# Patient Record
Sex: Male | Born: 1965 | Race: White | Hispanic: No | Smoking: Current every day smoker
Health system: Southern US, Community
[De-identification: ages and names within clinical notes are randomized; demographics above are authoritative.]

## PROBLEM LIST (undated history)

## (undated) DIAGNOSIS — S129XXA Fracture of neck, unspecified, initial encounter: Secondary | ICD-10-CM

## (undated) DIAGNOSIS — I10 Essential (primary) hypertension: Secondary | ICD-10-CM

---

## 1984-07-13 DIAGNOSIS — S129XXA Fracture of neck, unspecified, initial encounter: Secondary | ICD-10-CM

## 1984-07-13 HISTORY — DX: Fracture of neck, unspecified, initial encounter: S12.9XXA

## 1998-04-19 ENCOUNTER — Inpatient Hospital Stay (HOSPITAL_COMMUNITY): Admission: EM | Admit: 1998-04-19 | Discharge: 1998-04-22 | Payer: Self-pay | Admitting: Emergency Medicine

## 1999-11-14 ENCOUNTER — Emergency Department (HOSPITAL_COMMUNITY): Admission: EM | Admit: 1999-11-14 | Discharge: 1999-11-14 | Payer: Self-pay | Admitting: Emergency Medicine

## 1999-11-14 ENCOUNTER — Encounter: Payer: Self-pay | Admitting: Emergency Medicine

## 2012-09-20 ENCOUNTER — Emergency Department (INDEPENDENT_AMBULATORY_CARE_PROVIDER_SITE_OTHER)
Admission: EM | Admit: 2012-09-20 | Discharge: 2012-09-20 | Disposition: A | Discharge status: Home / Self Care | Payer: Self-pay | Source: Home / Self Care | Attending: Family Medicine | Admitting: Family Medicine

## 2012-09-20 ENCOUNTER — Encounter (HOSPITAL_COMMUNITY): Payer: Self-pay | Admitting: *Deleted

## 2012-09-20 DIAGNOSIS — S0181XA Laceration without foreign body of other part of head, initial encounter: Secondary | ICD-10-CM

## 2012-09-20 DIAGNOSIS — Z23 Encounter for immunization: Secondary | ICD-10-CM

## 2012-09-20 DIAGNOSIS — S0180XA Unspecified open wound of other part of head, initial encounter: Secondary | ICD-10-CM

## 2012-09-20 MED ORDER — TETANUS-DIPHTH-ACELL PERTUSSIS 5-2.5-18.5 LF-MCG/0.5 IM SUSP
0.5000 mL | Freq: Once | INTRAMUSCULAR | Status: AC
  Administered 2012-09-20: 0.5 mL via INTRAMUSCULAR

## 2012-09-20 MED ORDER — TETANUS-DIPHTH-ACELL PERTUSSIS 5-2.5-18.5 LF-MCG/0.5 IM SUSP
INTRAMUSCULAR | Status: AC
  Filled 2012-09-20: qty 0.5

## 2012-09-20 NOTE — ED Notes (Signed)
Pt reports getting out of bed last night and tripping over pants in floor and hitting bridge of nose on doorway - no loc

## 2012-09-20 NOTE — ED Provider Notes (Signed)
History     CSN: [id number]  Arrival date & time 09/20/12  1419   First MD Initiated Contact with Patient 09/20/12 1503      Chief Complaint  Patient presents with  . Facial Laceration    (Consider location/radiation/quality/duration/timing/severity/associated sxs/prior treatment) Patient is a 47 y.o. male presenting with scalp laceration. The history is provided by the patient.  Head Laceration This is a new problem. The current episode started 12 to 24 hours ago (tripped getting out of bed and struck face on wall resulting in lac to bridge of nose from flap of skin loss., no bony deformity, sl soreness, no bleeding.). The problem has not changed since onset.   History reviewed. No pertinent past medical history.  History reviewed. No pertinent past surgical history.  Family History  Problem Relation Age of Onset  . Family history unknown: Yes    History  Substance Use Topics  . Smoking status: Current Every Day Smoker -- 0.50 packs/day    Types: Cigarettes  . Smokeless tobacco: Not on file  . Alcohol Use: Yes     Comment: socially      Review of Systems  Constitutional: Negative.   HENT: Negative.   Skin: Positive for wound.    Allergies  Review of patient's allergies indicates no known allergies.  Home Medications  No current outpatient prescriptions on file.  BP 136/84  Pulse 116  Temp(Src) 98.2 F (36.8 C) (Oral)  Resp 20  SpO2 98%  Physical Exam  Nursing note and vitals reviewed. Constitutional: He is oriented to person, place, and time. He appears well-developed and well-nourished.  HENT:  Head: Normocephalic.    Eyes: Conjunctivae are normal. Pupils are equal, round, and reactive to light.  Neck: Normal range of motion. Neck supple.  Neurological: He is alert and oriented to person, place, and time.  Skin: Skin is warm and dry.    ED Course  Procedures (including critical care time)  Labs Reviewed - No data to display No results  found.   1. Facial laceration, initial encounter       MDM          Linna Hoff, MD 09/20/12 [phone]

## 2015-03-29 ENCOUNTER — Encounter (HOSPITAL_COMMUNITY): Payer: Self-pay

## 2015-03-29 ENCOUNTER — Emergency Department (HOSPITAL_COMMUNITY)
Admission: EM | Admit: 2015-03-29 | Discharge: 2015-03-29 | Disposition: A | Discharge status: Home / Self Care | Payer: Self-pay | Attending: Emergency Medicine | Admitting: Emergency Medicine

## 2015-03-29 DIAGNOSIS — Z8781 Personal history of (healed) traumatic fracture: Secondary | ICD-10-CM | POA: Insufficient documentation

## 2015-03-29 DIAGNOSIS — R030 Elevated blood-pressure reading, without diagnosis of hypertension: Secondary | ICD-10-CM | POA: Insufficient documentation

## 2015-03-29 DIAGNOSIS — IMO0001 Reserved for inherently not codable concepts without codable children: Secondary | ICD-10-CM

## 2015-03-29 DIAGNOSIS — M5416 Radiculopathy, lumbar region: Secondary | ICD-10-CM

## 2015-03-29 DIAGNOSIS — Z720 Tobacco use: Secondary | ICD-10-CM | POA: Insufficient documentation

## 2015-03-29 HISTORY — DX: Fracture of neck, unspecified, initial encounter: S12.9XXA

## 2015-03-29 MED ORDER — METHOCARBAMOL 500 MG PO TABS: 1000.0000 mg | ORAL_TABLET | Freq: Four times a day (QID) | ORAL | Status: DC | PRN

## 2015-03-29 MED ORDER — PREDNISONE 50 MG PO TABS: ORAL_TABLET | ORAL | Status: DC

## 2015-03-29 MED ORDER — PREDNISONE 20 MG PO TABS
60.0000 mg | ORAL_TABLET | Freq: Once | ORAL | Status: AC
  Administered 2015-03-29: 60 mg via ORAL
  Filled 2015-03-29: qty 3

## 2015-03-29 MED ORDER — METHOCARBAMOL 500 MG PO TABS
1000.0000 mg | ORAL_TABLET | Freq: Once | ORAL | Status: AC
Start: 2015-03-29 — End: 2015-03-29
  Administered 2015-03-29: 1000 mg via ORAL
  Filled 2015-03-29: qty 2

## 2015-03-29 MED ORDER — OXYCODONE-ACETAMINOPHEN 5-325 MG PO TABS: ORAL_TABLET | ORAL | Status: DC

## 2015-03-29 MED ORDER — MORPHINE SULFATE (PF) 4 MG/ML IV SOLN
6.0000 mg | Freq: Once | INTRAVENOUS | Status: AC
  Administered 2015-03-29: 6 mg via INTRAMUSCULAR
  Filled 2015-03-29: qty 2

## 2015-03-29 NOTE — ED Provider Notes (Signed)
CSN: [id number]     Arrival date & time 03/29/15  0610 History   First MD Initiated Contact with Patient 03/29/15 [phone]     Chief Complaint  Patient presents with  . Back Pain     (Consider location/radiation/quality/duration/timing/severity/associated sxs/prior Treatment) HPI   Blood pressure 175/113, pulse 97, temperature 97.7 F (36.5 C), temperature source Oral, resp. rate 18, SpO2 99 %.  KMARI HALTER is a 49 y.o. male complaining of severe right-sided low back pain onset 1 week ago after patient had been lifting his father up and down getting out of the wheelchair and digging holes for fence post. Patient's had similar exacerbations in the past. He states the pain radiates to the right gluteus. He's been taking Aleve at home with little relief. States pain is exacerbated by change in position. Denies fever, chills, change in bowel or bladder habits, h/o IDVU or cancer, numbness or weakness.   Past Medical History  Diagnosis Date  . Neck fracture 1986   History reviewed. No pertinent past surgical history. Family History  Problem Relation Age of Onset  . Family history unknown: Yes   Social History  Substance Use Topics  . Smoking status: Current Every Day Smoker -- 0.50 packs/day    Types: Cigarettes  . Smokeless tobacco: None  . Alcohol Use: Yes     Comment: "a 12 pack a week"    Review of Systems  10 systems reviewed and found to be negative, except as noted in the HPI.   Allergies  Review of patient's allergies indicates no known allergies.  Home Medications   Prior to Admission medications   Not on File   BP 175/113 mmHg  Pulse 97  Temp(Src) 97.7 F (36.5 C) (Oral)  Resp 18  SpO2 99% Physical Exam  Constitutional: He appears well-developed and well-nourished.  HENT:  Head: Normocephalic.  Eyes: Conjunctivae are normal.  Neck: Normal range of motion.  Cardiovascular: Normal rate, regular rhythm and intact distal pulses.   Pulmonary/Chest:  Effort normal.  Abdominal: Soft. There is no tenderness.  Neurological: He is alert.  No point tenderness to percussion of lumbar spinal processes.  No TTP or paraspinal muscular spasm. Strength is 5 out of 5 to bilateral lower extremities at hip and knee; extensor hallucis longus 5 out of 5. Ankle strength 5 out of 5, no clonus, neurovascularly intact. No saddle anaesthesia. Patellar reflexes are 2+ bilaterally.    Straight leg raise is positive on the right side at 10, negative on the left.  Patient becomes tearful when he lays down flat secondary to pain   Psychiatric: He has a normal mood and affect.  Nursing note and vitals reviewed.   ED Course  Procedures (including critical care time) Labs Review Labs Reviewed - No data to display  Imaging Review No results found. I have personally reviewed and evaluated these images and lab results as part of my medical decision-making.   EKG Interpretation None      MDM   Final diagnoses:  Elevated blood pressure  Lumbar radiculopathy, acute    Filed Vitals:   03/29/15 0618 03/29/15 0716  BP: 175/113 142/92  Pulse: 97 101  Temp: 97.7 F (36.5 C) 97.9 F (36.6 C)  TempSrc: Oral Oral  Resp: 18 14  Height:  5\' 11"  (1.803 m)  Weight:  240 lb (108.863 kg)  SpO2: 99% 95%    Medications  morphine 4 MG/ML injection 6 mg (6 mg Intramuscular Given 03/29/15 0653)  methocarbamol (  ROBAXIN) tablet 1,000 mg (1,000 mg Oral Given 03/29/15 0653)  predniSONE (DELTASONE) tablet 60 mg (60 mg Oral Given 03/29/15 0653)    Kennieth Francois is a pleasant 49 y.o. male presenting with  back pain.  No neurological deficits and normal neuro exam.  Patient can walk but states is painful.  No loss of bowel or bladder control.  No concern for cauda equina.  No fever, night sweats, weight loss, h/o cancer, IVDU.  RICE protocol and pain medicine indicated and discussed with patient.   Advised patient that his blood pressure was elevated and  counseled him on smoking cessation for 5 minutes. Resource guide given so he can establish primary care.  Evaluation does not show pathology that would require ongoing emergent intervention or inpatient treatment. Pt is hemodynamically stable and mentating appropriately. Discussed findings and plan with patient/guardian, who agrees with care plan. All questions answered. Return precautions discussed and outpatient follow up given.   New Prescriptions   METHOCARBAMOL (ROBAXIN) 500 MG TABLET    Take 2 tablets (1,000 mg total) by mouth 4 (four) times daily as needed (Pain).   OXYCODONE-ACETAMINOPHEN (PERCOCET/ROXICET) 5-325 MG PER TABLET    1 to 2 tabs PO q6hrs  PRN for pain   PREDNISONE (DELTASONE) 50 MG TABLET    Take 1 tablet daily with breakfast         Wynetta Emery, PA-C 03/29/15 0734  Wynetta Emery, PA-C 03/29/15 4098  Derwood Kaplan, MD 03/29/15 [phone]

## 2015-03-29 NOTE — ED Notes (Signed)
Pt states that he has had lower right back pain since Tuesday after picking up his father and putting him in a wheelchair. Pt states that he also dug a hole for a fence post and the back pain has been moderate to severe since then. Pt able to get up out of wheelchair and walk over to the bed grimmacing in pain. Pt denies urinary sx. Has no hx of sciatica.

## 2015-03-29 NOTE — Discharge Instructions (Signed)
Please follow with your primary care doctor in the next 5 days for high blood pressure evaluation. If you do not have a primary care doctor, present to urgent care. Reduce salt intake. Seek emergency medical care for unilateral weakness, slurring, change in vision, or chest pain and shortness of breath.  Please take ibuprofen  (this is normally 2 over the counter pills) every 6 hours (take with food to minimze stomach irritation).   Take robaxin and/or percocet for breakthrough pain, do not drink alcohol, drive, care for children or perfom other critical tasks while taking robaxin and/or percocet.  Do not hesitate to return to the emergency room for any new, worsening or concerning symptoms.  Please obtain primary care using resource guide below. Let them know that you were seen in the emergency room and that they will need to obtain records for further outpatient management.    Emergency Department Resource Guide 1) Find a Doctor and Pay Out of Pocket Although you won't have to find out who is covered by your insurance plan, it is a good idea to ask around and get recommendations. You will then need to call the office and see if the doctor you have chosen will accept you as a new patient and what types of options they offer for patients who are self-pay. Some doctors offer discounts or will set up payment plans for their patients who do not have insurance, but you will need to ask so you aren't surprised when you get to your appointment.  2) Contact Your Local Health Department Not all health departments have doctors that can see patients for sick visits, but many do, so it is worth a call to see if yours does. If you don't know where your local health department is, you can check in your phone book. The CDC also has a tool to help you locate your state's health department, and many state websites also have listings of all of their local health departments.  3) Find a Walk-in Clinic If your  illness is not likely to be very severe or complicated, you may want to try a walk in clinic. These are popping up all over the country in pharmacies, drugstores, and shopping centers. They're usually staffed by nurse practitioners or physician assistants that have been trained to treat common illnesses and complaints. They're usually fairly quick and inexpensive. However, if you have serious medical issues or chronic medical problems, these are probably not your best option.  No Primary Care Doctor: - Call Health Connect at  [phone] - they can help you locate a primary care doctor that  accepts your insurance, provides certain services, etc. - Physician Referral Service- [phone]  Chronic Pain Problems: Organization         Address  Phone   Notes  [facility]  [phone] Patients need to be referred by their primary care doctor.   Medication Assistance: Organization         Address  Phone   Notes  [facility] Medication [facility] [address] [place], [place] [address] [phone] --Must be a resident of [facility] -- Must have NO insurance coverage whatsoever (no Medicaid/ Medicare, etc.) -- The pt. MUST have a primary care doctor that directs their care regularly and follows them in the community   MedAssist  [phone]   [employer]  [phone]    Agencies that provide inexpensive medical care: Organization  Address  Phone   Notes  [place]  [phone]   [facility]    [phone]   [facility] [place], [place] [address] [phone]   [place] [address] [place] [place] [phone]   Planned Parenthood    [phone]   [facility]    [phone]   [place] [address], [place] Phone:  [phone], Fax:  [phone] Hours of Operation:   9 am - 6 pm, M-F.  Also accepts Medicaid/Medicare and self-pay.  [facility] for [place], [address], [place] Phone: [phone], Fax: [phone]. Hours of Operation:  8:30 am - 5:30 pm, M-F.  Also accepts Medicaid and self-pay.  [facility] [address] [place] Phone: [phone]   [place], [place], [place] [phone], Ext. [phone] Mondays & Thursdays: 7-9 AM.  First 15 patients are seen on a first come, first serve basis.    Marty Providers:  Organization         Address  Phone   Notes  [facility] [address], [place] [phone] Also accepts self-pay patients.  [facility] [address], [place]  [phone]   [place], [place] [phone]   [facility] [address] [place] [phone]   Lucianne Lei [address], [place]   [phone] Only accepts [place] Access [place] patients after they have their name applied to their card.   Self-Pay (no insurance) in [facility]:  Organization         Address  Phone   Notes  Sickle Cell Patients, [facility] [place] [phone]   [facility] Urgent Care [place] [phone]   [facility] [place]  [place], [place], [place] [phone]   [facility]/Dr. Osei-Bonsu  [address], [place] or [address], [place] [phone] Phone number for both [place] and [place] locations is the same.  Urgent Medical and [facility] [address] [place] [phone]   [facility] [address] [place] or [address] [phone]   [facility] [address], [place] [phone], phone; [phone], fax Sees patients 1st and 3rd Saturday of every month.  Must not qualify for public or private insurance (i.e. Medicaid, Medicare, [place] Health Choice, Veterans' Benefits)  Household income should be no more than 200% of the poverty level The clinic cannot treat you if you are pregnant or think you are pregnant  Sexually transmitted diseases are not treated at the clinic.    Dental Care: Organization         Address  Phone  Notes  [facility] Department of [facility] [place] [phone] Accepts children up to age 107 who are enrolled in [place] or [place]; pregnant women with a Medicaid card; and children who have applied for Medicaid or [place] Health Choice, but were declined, whose parents can pay a reduced fee at time  of service.  [facility] Department of [facility]  [address], [place] [phone] Accepts children up to age 42 who are enrolled in [place] or [place]; pregnant women with a Medicaid card; and children who have applied for Medicaid or [place] Health Choice, but were declined, whose parents can pay a reduced fee at time of service.  [place] Adult Dental Access PROGRAM  [place] [phone] Patients are seen by appointment only. Walk-ins are not accepted. [place] will see patients 79 years of age and older. Monday - Tuesday (8am-5pm) Most Wednesdays (8:30-5pm) $30 per visit, cash only  [facility] Adult Dental Access PROGRAM  [address], [facility] [phone] Patients are seen by appointment only. Walk-ins are not accepted. [place] will see patients 47 years of age and older. One Wednesday Evening (Monthly: Volunteer Based).  $30 per visit, cash only  [place]  [phone] for adults; Children under age 32, call Graduate Pediatric Dentistry at  [phone]. Children aged 17-14, please call [phone] to request a pediatric application.  Dental services are provided in all areas of dental care including fillings, crowns and bridges, complete and partial dentures, implants, gum treatment, root canals, and extractions. Preventive care is also provided. Treatment is provided to both adults and children. Patients are selected via a lottery and there is often a waiting list.   [facility] [address], [place]  [phone] [email or web address]   [facility] [address] [place], [place] [phone], Ext. [phone] Second and Fourth Thursday of each month, opens at 6:30 AM; Clinic ends at 9 AM.  Patients are seen on a first-come first-served basis, and a limited number are seen during each clinic.   [facility]  [address] Hillard Danker [place], [place] [phone]   Eligibility Requirements You must have lived in [place], [place], or [place] counties for at least the last three months.   You cannot be eligible for state or federal sponsored [employer], including [employer], [place], or [employer].   You generally cannot be eligible for healthcare insurance through your employer.    How to apply: Eligibility screenings are held every Tuesday and Wednesday afternoon from 1:00 pm until 4:00 pm. You do not need an appointment for the interview!  [facility] [address], [place], [place]   [place]  Minor Department  [place]  [phone]    Behavioral Health Resources in the Community: Intensive Outpatient Programs Organization         Address  Phone  Notes  [place]. [address] [place], [place] [phone]   [facility] Outpatient [address], [place], [place]   ADS: Alcohol &  Drug Svcs [address], [place], [place]   [place] [address]  [place], [place] or [phone]   Substance Abuse Resources Organization         Address  Phone  Notes  Alcohol and Drug Services  [phone]   [place]  [phone]   [facility] [place]   Chinita Pester  [phone]   Residential & Outpatient Substance Abuse Program  [phone]   Psychological Services Organization         Address  Phone  Notes  [facility]  [place]  [phone]   [place] [address], [place] or [phone]    Mobile Crisis Teams Organization         Address  Phone  Notes  [facility]rapeutic Alternatives, Mobile Crisis Care Unit  [phone]   Assertive Psychotherapeutic Services  [address] [place], [place]   [place] Levels [address] [place] [phone]    Self-Help/Support Groups Organization         Address  Phone             Notes  [place]. of [place] - variety of support groups  [place] Call for more information  Narcotics Anonymous (NA), [facility] [address], [employer] [place]  2 meetings at this location   Special educational needs teacher         Address  Phone  Notes  ASAP Residential Treatment [place],    [place]  [phone]   [facility]  [address] [place] [phone], [place], [place]   [place], [place] [phone] Admissions: 8am-3pm M-F  Incentives Substance [place] [address]    [place], [place] [phone]   [facility] [address] [place], [place], [place]   [facility] [address]  [place], [place]   [facility]ight Programs - Intensive Outpatient [address], [place], [place]   [facility] [address].,  [place], [place] [phone] or [phone]   [facility]idential Treatment Services (RTS) [address] [place], [place] Accepts Medicaid  [facility] [place] [address]  [place] [phone] Substance Abuse/Addiction Treatment   [facility] Organization         Address  Phone  Notes  [facility]  [phone]   Domenic Schwab, PhD [address] Arlis [place], [place]   [phone] or [phone]   [place], [place] [phone]   [facility] [address], [place], [place] [phone] Insurance/Medicaid/sponsorship through [facility] and Families [address] [place]                                    [place], [place] [phone] [place] [address], [place] [phone]    Dr. Adele Schilder  [phone]   [facility] Dept. 1) [address], [place] 2) [place] 3)  [address], [place] [phone]  [phone]   [place] [phone] or [phone] (After Hours)

## 2015-04-03 ENCOUNTER — Encounter (HOSPITAL_COMMUNITY): Payer: Self-pay | Admitting: *Deleted

## 2015-04-03 ENCOUNTER — Emergency Department (INDEPENDENT_AMBULATORY_CARE_PROVIDER_SITE_OTHER)
Admission: EM | Admit: 2015-04-03 | Discharge: 2015-04-03 | Disposition: A | Discharge status: Home / Self Care | Payer: Self-pay | Source: Home / Self Care | Attending: Family Medicine | Admitting: Family Medicine

## 2015-04-03 DIAGNOSIS — M5441 Lumbago with sciatica, right side: Secondary | ICD-10-CM

## 2015-04-03 MED ORDER — CYCLOBENZAPRINE HCL 5 MG PO TABS: 5.0000 mg | ORAL_TABLET | Freq: Three times a day (TID) | ORAL | Status: DC | PRN

## 2015-04-03 MED ORDER — KETOROLAC TROMETHAMINE 30 MG/ML IJ SOLN
INTRAMUSCULAR | Status: AC
  Filled 2015-04-03: qty 1

## 2015-04-03 MED ORDER — METHYLPREDNISOLONE ACETATE 80 MG/ML IJ SUSP
INTRAMUSCULAR | Status: AC
Start: 2015-04-03 — End: 2015-04-03
  Filled 2015-04-03: qty 1

## 2015-04-03 MED ORDER — METHYLPREDNISOLONE 4 MG PO TBPK: ORAL_TABLET | ORAL | Status: DC

## 2015-04-03 MED ORDER — METHYLPREDNISOLONE ACETATE 80 MG/ML IJ SUSP
80.0000 mg | Freq: Once | INTRAMUSCULAR | Status: AC
  Administered 2015-04-03: 80 mg via INTRAMUSCULAR

## 2015-04-03 MED ORDER — KETOROLAC TROMETHAMINE 30 MG/ML IJ SOLN
30.0000 mg | Freq: Once | INTRAMUSCULAR | Status: AC
  Administered 2015-04-03: 30 mg via INTRAMUSCULAR

## 2015-04-03 NOTE — ED Notes (Signed)
Pt  Reports  He  Injured his  Back   About  1  Week  Ago  After  Lifting  And  Working on a  Fence   He  Reports  Pain in his   Lower back   He  Is  Walking   With a  [employer]

## 2015-04-03 NOTE — Discharge Instructions (Signed)
Use medicine as prescribed, see orthopedist for further back eval and care. Avoid sitting.

## 2015-04-03 NOTE — ED Provider Notes (Signed)
CSN: [id number]     Arrival date & time 04/03/15  1709 History   First MD Initiated Contact with Patient 04/03/15 1807     Chief Complaint  Patient presents with  . Back Pain   (Consider location/radiation/quality/duration/timing/severity/associated sxs/prior Treatment) Patient is a 49 y.o. male presenting with back pain. The history is provided by the patient and the spouse.  Back Pain Location:  Lumbar spine Quality:  Stabbing and shooting Radiates to:  R posterior upper leg and R foot Pain severity:  Moderate Onset quality:  Gradual (seen in er 9/16, meds did not help.) Duration:  2 weeks Progression:  Worsening Chronicity:  New Context: lifting heavy objects   Context comment:  Lifting father from wheelchair, digging fence post holes. Relieved by:  Nothing Associated symptoms: leg pain   Associated symptoms: no abdominal pain, no abdominal swelling, no bladder incontinence, no chest pain, no fever, no paresthesias and no weakness     Past Medical History  Diagnosis Date  . Neck fracture 1986   History reviewed. No pertinent past surgical history. Family History  Problem Relation Age of Onset  . Family history unknown: Yes   Social History  Substance Use Topics  . Smoking status: Current Every Day Smoker -- 0.50 packs/day    Types: Cigarettes  . Smokeless tobacco: None  . Alcohol Use: Yes     Comment: "a 12 pack a week"    Review of Systems  Constitutional: Negative.  Negative for fever.  Cardiovascular: Negative for chest pain.  Gastrointestinal: Negative.  Negative for abdominal pain.  Genitourinary: Negative.  Negative for bladder incontinence.  Musculoskeletal: Positive for back pain and gait problem.  Skin: Negative.   Neurological: Negative for weakness and paresthesias.  All other systems reviewed and are negative.   Allergies  Review of patient's allergies indicates no known allergies.  Home Medications   Prior to Admission medications     Medication Sig Start Date End Date Taking? Authorizing Provider  cyclobenzaprine (FLEXERIL) 5 MG tablet Take 1 tablet (5 mg total) by mouth 3 (three) times daily as needed for muscle spasms. 04/03/15   Linna Hoff, MD  methocarbamol (ROBAXIN) 500 MG tablet Take 2 tablets (1,000 mg total) by mouth 4 (four) times daily as needed (Pain). 03/29/15   Nicole Pisciotta, PA-C  methylPREDNISolone (MEDROL DOSEPAK) 4 MG TBPK tablet follow package directions, start on thurs, take until finished 04/03/15   Linna Hoff, MD  oxyCODONE-acetaminophen (PERCOCET/ROXICET) 5-325 MG per tablet 1 to 2 tabs PO q6hrs  PRN for pain 03/29/15   Joni Reining Pisciotta, PA-C  predniSONE (DELTASONE) 50 MG tablet Take 1 tablet daily with breakfast 03/29/15   Wynetta Emery, PA-C   Meds Ordered and Administered this Visit   Medications  ketorolac (TORADOL) 30 MG/ML injection 30 mg (not administered)  methylPREDNISolone acetate (DEPO-MEDROL) injection 80 mg (not administered)    BP 158/111 mmHg  Pulse 103  Temp(Src) 98.2 F (36.8 C) (Oral)  Resp 20  SpO2 96% No data found.   Physical Exam  Constitutional: He is oriented to person, place, and time. He appears well-developed and well-nourished.  Abdominal: Soft. Bowel sounds are normal.  Musculoskeletal: He exhibits tenderness.       Back:  Neurological: He is alert and oriented to person, place, and time.  Skin: Skin is warm and dry.  Nursing note and vitals reviewed.   ED Course  Procedures (including critical care time)  Labs Review Labs Reviewed - No data to display  Imaging Review No results found.   Visual Acuity Review  Right Eye Distance:   Left Eye Distance:   Bilateral Distance:    Right Eye Near:   Left Eye Near:    Bilateral Near:         MDM   1. Right-sided low back pain with right-sided sciatica        Linna Hoff, MD 04/03/15 [phone]

## 2015-04-17 ENCOUNTER — Ambulatory Visit: Payer: Self-pay | Attending: Internal Medicine

## 2015-04-20 ENCOUNTER — Emergency Department (HOSPITAL_COMMUNITY): Payer: Self-pay

## 2015-04-20 ENCOUNTER — Encounter (HOSPITAL_COMMUNITY): Payer: Self-pay | Admitting: Emergency Medicine

## 2015-04-20 ENCOUNTER — Emergency Department (HOSPITAL_COMMUNITY)
Admission: EM | Admit: 2015-04-20 | Discharge: 2015-04-20 | Disposition: A | Discharge status: Home / Self Care | Payer: Self-pay | Attending: Emergency Medicine | Admitting: Emergency Medicine

## 2015-04-20 DIAGNOSIS — Y998 Other external cause status: Secondary | ICD-10-CM | POA: Insufficient documentation

## 2015-04-20 DIAGNOSIS — Y9389 Activity, other specified: Secondary | ICD-10-CM | POA: Insufficient documentation

## 2015-04-20 DIAGNOSIS — L600 Ingrowing nail: Secondary | ICD-10-CM | POA: Insufficient documentation

## 2015-04-20 DIAGNOSIS — M5441 Lumbago with sciatica, right side: Secondary | ICD-10-CM | POA: Insufficient documentation

## 2015-04-20 DIAGNOSIS — Z720 Tobacco use: Secondary | ICD-10-CM | POA: Insufficient documentation

## 2015-04-20 DIAGNOSIS — W1839XA Other fall on same level, initial encounter: Secondary | ICD-10-CM | POA: Insufficient documentation

## 2015-04-20 DIAGNOSIS — Z7952 Long term (current) use of systemic steroids: Secondary | ICD-10-CM | POA: Insufficient documentation

## 2015-04-20 DIAGNOSIS — Y9289 Other specified places as the place of occurrence of the external cause: Secondary | ICD-10-CM | POA: Insufficient documentation

## 2015-04-20 DIAGNOSIS — S82832A Other fracture of upper and lower end of left fibula, initial encounter for closed fracture: Secondary | ICD-10-CM | POA: Insufficient documentation

## 2015-04-20 MED ORDER — TIZANIDINE HCL 4 MG PO TABS: 4.0000 mg | ORAL_TABLET | Freq: Four times a day (QID) | ORAL | Status: DC | PRN

## 2015-04-20 MED ORDER — METHOCARBAMOL 500 MG PO TABS
500.0000 mg | ORAL_TABLET | Freq: Once | ORAL | Status: AC
  Administered 2015-04-20: 500 mg via ORAL
  Filled 2015-04-20: qty 1

## 2015-04-20 MED ORDER — DIAZEPAM 5 MG PO TABS: 5.0000 mg | ORAL_TABLET | Freq: Every evening | ORAL | Status: DC | PRN

## 2015-04-20 MED ORDER — OXYCODONE-ACETAMINOPHEN 5-325 MG PO TABS
2.0000 | ORAL_TABLET | Freq: Once | ORAL | Status: AC
  Administered 2015-04-20: 2 via ORAL
  Filled 2015-04-20: qty 2

## 2015-04-20 MED ORDER — MELOXICAM 15 MG PO TABS
15.0000 mg | ORAL_TABLET | Freq: Every day | ORAL | Status: DC
Start: 2015-04-20 — End: 2017-03-14

## 2015-04-20 MED ORDER — CEPHALEXIN 500 MG PO CAPS: 500.0000 mg | ORAL_CAPSULE | Freq: Four times a day (QID) | ORAL | Status: DC

## 2015-04-20 NOTE — ED Notes (Signed)
Pt states left ankle twisted on him Monday night. Pedal pulse present. Pt states back pain is ongoing and that he was advised to go see his primary care provider which he does not have. Pt ambulatory to triage. A/O x4 and NAD noted.

## 2015-04-20 NOTE — Discharge Instructions (Signed)
1. Medications:mobic, zanaflex, keflex, usual home medications 2. Treatment: rest, drink plenty of fluids, gentle stretching as discussed, alternate ice and heat; keep splint dry; do not walk on your ankle 3. Follow Up: Please followup with the referral orthopedist in 3 days. Please contact the wellness Center to set up a primary care appointment. Return to the emergency department for possible bowel or bladder control, worsening pain, numbness, tingling or other concerns.    Cast or Splint Care Casts and splints support injured limbs and keep bones from moving while they heal. It is important to care for your cast or splint at home.  HOME CARE INSTRUCTIONS  Keep the cast or splint uncovered during the drying period. It can take 24 to 48 hours to dry if it is made of plaster. A fiberglass cast will dry in less than 1 hour.  Do not rest the cast on anything harder than a pillow for the first 24 hours.  Do not put weight on your injured limb or apply pressure to the cast until your health care provider gives you permission.  Keep the cast or splint dry. Wet casts or splints can lose their shape and may not support the limb as well. A wet cast that has lost its shape can also create harmful pressure on your skin when it dries. Also, wet skin can become infected.  Cover the cast or splint with a plastic bag when bathing or when out in the rain or snow. If the cast is on the trunk of the body, take sponge baths until the cast is removed.  If your cast does become wet, dry it with a towel or a blow dryer on the cool setting only.  Keep your cast or splint clean. Soiled casts may be wiped with a moistened cloth.  Do not place any hard or soft foreign objects under your cast or splint, such as cotton, toilet paper, lotion, or powder.  Do not try to scratch the skin under the cast with any object. The object could get stuck inside the cast. Also, scratching could lead to an infection. If itching is  a problem, use a blow dryer on a cool setting to relieve discomfort.  Do not trim or cut your cast or remove padding from inside of it.  Exercise all joints next to the injury that are not immobilized by the cast or splint. For example, if you have a long leg cast, exercise the hip joint and toes. If you have an arm cast or splint, exercise the shoulder, elbow, thumb, and fingers.  Elevate your injured arm or leg on 1 or 2 pillows for the first 1 to 3 days to decrease swelling and pain.It is best if you can comfortably elevate your cast so it is higher than your heart. SEEK MEDICAL CARE IF:   Your cast or splint cracks.  Your cast or splint is too tight or too loose.  You have unbearable itching inside the cast.  Your cast becomes wet or develops a soft spot or area.  You have a bad smell coming from inside your cast.  You get an object stuck under your cast.  Your skin around the cast becomes red or raw.  You have new pain or worsening pain after the cast has been applied. SEEK IMMEDIATE MEDICAL CARE IF:   You have fluid leaking through the cast.  You are unable to move your fingers or toes.  You have discolored (blue or white), cool, painful, or  very swollen fingers or toes beyond the cast.  You have tingling or numbness around the injured area.  You have severe pain or pressure under the cast.  You have any difficulty with your breathing or have shortness of breath.  You have chest pain.   This information is not intended to replace advice given to you by your health care provider. Make sure you discuss any questions you have with your health care provider.   Document Released: 06/26/2000 Document Revised: 04/19/2013 Document Reviewed: 01/05/2013 Elsevier Interactive Patient Education [employer]

## 2015-04-20 NOTE — Progress Notes (Signed)
Orthopedic Tech Progress Note Patient Details:  Daniel Hanson [facility] [birth year] [id number] Applied fiberglass posterior short leg splint and fiberglass stirrup splint to LLE.  Pulses, sensation, motion intact before and after splinting.  Capillary refill less than 2 seconds before and after splinting.  Fit pt. for crutches and taught use of same.  Upon practice with the crutches, pt. stated "It hurts my right leg too much.  I can't do this."  Assisted pt. back to exam chair and helped him sit.  Reminded pt. he should not put any weight on his injured leg, and reported pt.'s difficulty using crutches to ED MD. Ortho Devices Type of Ortho Device: Crutches, Stirrup splint, Short leg splint Ortho Device/Splint Location: LLE Ortho Device/Splint Interventions: Application   Lesle Chris 04/20/2015, 8:57 PM

## 2015-04-20 NOTE — ED Provider Notes (Signed)
CSN: [id number]     Arrival date & time 04/20/15  1856 History  By signing my name below, I, Daniel Hanson, attest that this documentation has been prepared under the direction and in the presence of non-physician practitioner, Dierdre Forth, PA-C. Electronically Signed: Freida Hanson, Scribe. 04/20/2015. 8:28 PM.    Chief Complaint  Patient presents with  . Back Pain  . Ankle Pain    The history is provided by the patient. No language interpreter was used.   HPI Comments:  Daniel Hanson is a 49 y.o. male who presents to the Emergency Department complaining of persistent lower right sided back pain that started 3 weeks ago. He notes his pain radiates down his right thigh. He was seen in the ED on 04/03/2015 states he was given a shot and that his pain has slightly. He was also evaluated at an urgent care. He was reportedly given Demerol at the urgent care with moderate relief. Between his ED and urgent care visits he has been given prednisone, oxycodone and cyclobenzaprine with little improvement. He notes his overall pain has slightly improved but is still painful. He states that he is walking more upright that previously.  He denies numbness, weakness, gait disturbance, loss of bowel or bladder control.  He reports difficulty sleeping due to pain. He denies h/o CA, IVDA, and DM.  He also complains of moderate left ankle pain after he twisted the ankle ~ 6 days ago. He reports he inverted the ankle causing a fall.  He has been able to ambulate since symptom onset with moderate pain. No alleviating factors noted.  He denies numbness, tingling or discoloration.    No PCP  Past Medical History  Diagnosis Date  . Neck fracture (HCC) 1986   History reviewed. No pertinent past surgical history. Family History  Problem Relation Age of Onset  . Family history unknown: Yes   Social History  Substance Use Topics  . Smoking status: Current Every Day Smoker -- 0.50 packs/day     Types: Cigarettes  . Smokeless tobacco: None  . Alcohol Use: Yes     Comment: "a 12 pack a week"    Review of Systems  Constitutional: Negative for fever, chills and fatigue.  Respiratory: Negative for chest tightness and shortness of breath.   Cardiovascular: Negative for chest pain.  Gastrointestinal: Negative for nausea, vomiting, abdominal pain and diarrhea.  Genitourinary: Negative for dysuria, urgency, frequency and hematuria.  Musculoskeletal: Positive for back pain, arthralgias and gait problem ( 2/2 pain). Negative for joint swelling, neck pain and neck stiffness.       Left ankle  Skin: Negative for rash.  Neurological: Negative for weakness, light-headedness, numbness and headaches.  Hematological: Does not bruise/bleed easily.  Psychiatric/Behavioral: The patient is not nervous/anxious.   All other systems reviewed and are negative.  Allergies  Review of patient's allergies indicates no known allergies.  Home Medications   Prior to Admission medications   Medication Sig Start Date End Date Taking? Authorizing Provider  cyclobenzaprine (FLEXERIL) 5 MG tablet Take 1 tablet (5 mg total) by mouth 3 (three) times daily as needed for muscle spasms. 04/03/15   Linna Hoff, MD  methocarbamol (ROBAXIN) 500 MG tablet Take 2 tablets (1,000 mg total) by mouth 4 (four) times daily as needed (Pain). 03/29/15   Nicole Pisciotta, PA-C  methylPREDNISolone (MEDROL DOSEPAK) 4 MG TBPK tablet follow package directions, start on thurs, take until finished 04/03/15   Linna Hoff, MD  oxyCODONE-acetaminophen (PERCOCET/ROXICET)  5-325 MG per tablet 1 to 2 tabs PO q6hrs  PRN for pain 03/29/15   Joni Reining Pisciotta, PA-C  predniSONE (DELTASONE) 50 MG tablet Take 1 tablet daily with breakfast 03/29/15   Nicole Pisciotta, PA-C   BP 162/84 mmHg  Pulse 98  Temp(Src) 98.2 F (36.8 C) (Oral)  Ht  (1.803 m)  Wt 240 lb (108.863 kg)  BMI 33.49 kg/m2  SpO2 97% Physical Exam  Constitutional: He  appears well-developed and well-nourished. No distress.  HENT:  Head: Normocephalic and atraumatic.  Mouth/Throat: Oropharynx is clear and moist. No oropharyngeal exudate.  Eyes: Conjunctivae are normal.  Neck: Normal range of motion. Neck supple.  Full ROM without pain  Cardiovascular: Normal rate, regular rhythm, normal heart sounds and intact distal pulses.   No murmur heard. Capillary refill < 3 sec  Pulmonary/Chest: Effort normal and breath sounds normal. No respiratory distress. He has no wheezes.  Abdominal: Soft. He exhibits no distension. There is no tenderness.  Musculoskeletal: He exhibits tenderness. He exhibits no edema.  Full range of motion of the T-spine and L-spine No tenderness to palpation of the spinous processes of the T-spine or L-spine Right sided Mild tenderness to palpation of the paraspinous muscles of the L-spine worse over the SI joint  Left ankle: swelling and erythema noted with TTP over the lateral joint line, decreased ROM due to pain   Lymphadenopathy:    He has no cervical adenopathy.  Neurological: He is alert. He has normal reflexes. Coordination normal.  Reflex Scores:      Bicep reflexes are 2+ on the right side and 2+ on the left side.      Brachioradialis reflexes are 2+ on the right side and 2+ on the left side.      Patellar reflexes are 2+ on the right side and 2+ on the left side.      Achilles reflexes are 2+ on the right side and 2+ on the left side. Speech is clear and goal oriented, follows commands Normal 5/5 strength in upper and lower extremities bilaterally including dorsiflexion and plantar flexion (4/5 in the left ankle), strong and equal grip strength  Sensation normal to light and sharp touch Moves extremities without ataxia, coordination intact Antalgic gait Normal balance No Clonus   Skin: Skin is warm and dry. No rash noted. He is not diaphoretic. No erythema.  No tenting of the skin  Swelling and erythema to the lateral  portion of the left great toenail with purulent drainage  Psychiatric: He has a normal mood and affect. His behavior is normal.  Nursing note and vitals reviewed.   ED Course  Procedures   DIAGNOSTIC STUDIES:  Oxygen Saturation is 97% on RA, normal by my interpretation.    COORDINATION OF CARE:  7:50 PM Discussed treatment plan with pt at bedside and pt agreed to plan.  Labs Review Labs Reviewed - No data to display  Imaging Review Dg Ankle Complete Left  04/20/2015   CLINICAL DATA:  Fall with twisting injury 5 days ago.  EXAM: LEFT ANKLE COMPLETE - 3+ VIEW  COMPARISON:  None.  FINDINGS: There is an oblique distal fibular fracture with 1 cortex width lateral displacement. No other fractures are evident. The mortise is symmetric. Moderate lateral malleolar soft tissue swelling.  IMPRESSION: Minimally displaced distal fibular fracture.   Electronically Signed   By: Ellery Plunk M.D.   On: 04/20/2015 19:37   I have personally reviewed and evaluated these images as part of  my medical decision-making.   EKG Interpretation None      MDM   Final diagnoses:  Onychocryptosis  Right-sided low back pain with right-sided sciatica  Fracture of distal end of left fibula   Kennieth Francois presents wit multiple complaints.  Patient with ongoing right-sided back pain and sciatica for the last 3-4 weeks.  No neurological deficits and normal neuro exam.  Patient can walk but states is painful.  No loss of bowel or bladder control.  No concern for cauda equina.  No fever, night sweats, weight loss, h/o cancer, IVDU.  RICE protocol, anti-inflammatory sun muscle relaxers indicated and discussed with patient.   Patient X-Ray with minimally left displaced distal fibular fracture . Pain managed in ED. Pt advised to follow up with orthopedics for further evaluation and treatment.  Patient given  posterior splint in ED, conservative therapy recommended and discussed.  patient is to be  nonweightbearing.   On physical exam patient also noted to have infection to the lateral portion of the left great toe due to ingrown toenail. Will give Keflex and discuss conservative therapies including soaking and correct nail trimming.  8:47 PM Ortho tech expressed concern about pt being able to use the crutches.  I personally discussed this with the patient who denies weakness in the right leg, but reports increased sciatica pain when using the crutches.  He is able to use them with moderate proficiency in the ED.  Reminded patient that he is to be nonweight bearing until evaluated by Ortho.  Return precautions discussed.     BP 162/84 mmHg  Pulse 98  Temp(Src) 98.2 F (36.8 C) (Oral)  Ht 5\' 11"  (1.803 m)  Wt 240 lb (108.863 kg)  BMI 33.49 kg/m2  SpO2 97%  I personally performed the services described in this documentation, which was scribed in my presence. The recorded information has been reviewed and is accurate.   Dahlia Client Wladyslawa Disbro, PA-C 04/20/15 2334  Laurence Spates, MD 04/21/15 [phone]

## 2015-04-20 NOTE — ED Notes (Signed)
Ortho tech notified of orders. 

## 2015-04-24 ENCOUNTER — Ambulatory Visit: Payer: Self-pay | Attending: Internal Medicine

## 2015-04-25 ENCOUNTER — Ambulatory Visit: Payer: Self-pay | Attending: Internal Medicine | Admitting: Family Medicine

## 2015-04-25 ENCOUNTER — Encounter: Payer: Self-pay | Admitting: Family Medicine

## 2015-04-25 VITALS — BP 146/82 | HR 82 | Temp 98.3°F | Resp 16 | Ht 70.5 in | Wt 259.0 lb

## 2015-04-25 DIAGNOSIS — M5441 Lumbago with sciatica, right side: Secondary | ICD-10-CM | POA: Insufficient documentation

## 2015-04-25 DIAGNOSIS — R030 Elevated blood-pressure reading, without diagnosis of hypertension: Secondary | ICD-10-CM | POA: Insufficient documentation

## 2015-04-25 DIAGNOSIS — S82402A Unspecified fracture of shaft of left fibula, initial encounter for closed fracture: Secondary | ICD-10-CM

## 2015-04-25 DIAGNOSIS — IMO0001 Reserved for inherently not codable concepts without codable children: Secondary | ICD-10-CM

## 2015-04-25 DIAGNOSIS — S82409A Unspecified fracture of shaft of unspecified fibula, initial encounter for closed fracture: Secondary | ICD-10-CM | POA: Insufficient documentation

## 2015-04-25 DIAGNOSIS — F17210 Nicotine dependence, cigarettes, uncomplicated: Secondary | ICD-10-CM | POA: Insufficient documentation

## 2015-04-25 DIAGNOSIS — X501XXD Overexertion from prolonged static or awkward postures, subsequent encounter: Secondary | ICD-10-CM | POA: Insufficient documentation

## 2015-04-25 DIAGNOSIS — S82402D Unspecified fracture of shaft of left fibula, subsequent encounter for closed fracture with routine healing: Secondary | ICD-10-CM | POA: Insufficient documentation

## 2015-04-25 MED ORDER — TRAMADOL HCL 50 MG PO TABS: 50.0000 mg | ORAL_TABLET | Freq: Two times a day (BID) | ORAL | Status: DC | PRN

## 2015-04-25 NOTE — Progress Notes (Signed)
Pt's here for ED f/up for Onychocryptosis. Pt c/o pinch nerve in buttock that radiates down to his leg.  Pt states sitting causes pain and rates pain at level 4. When walking pt rates pain at level 10. Pt reports Pain has been going on now x4wks.  Pt would like to esc.care with a PCP. Pt requesting something for sleep aid.

## 2015-04-25 NOTE — Progress Notes (Signed)
CC:  HPI: Daniel Hanson is a 49 y.o. male who is here for an ED follow up from 04/20/15 where he was seen for left fibular fracture resulting from a twisted ankle which occurred one and a half weeks ago.  He states he had been lifting his Dad for weeks ago and subsequently sprained his back with reasulting radiation of pain down his right leg which affected his gait resulting in a twisted ankle. Xray of left ankle revealed a minimally displaced distal fibular fracture. He was placed in a cast, prescribed opioid analgesics and advised to follow up with Orthopedics.  Today he complains of persisting right sided back pain radiating to his right leg which is not relied with his current medications and this causes him to loose sleep. Pain is 10/10 at its worst. Denies numbness in extremities or loss of sphincteric function. His blood pressure is elevated and he has no known history of Hypertension.     No Known Allergies Past Medical History  Diagnosis Date  . Neck fracture (HCC) 1986   Current Outpatient Prescriptions on File Prior to Visit  Medication Sig Dispense Refill  . cephALEXin (KEFLEX) 500 MG capsule Take 1 capsule (500 mg total) by mouth 4 (four) times daily. 40 capsule 0  . diazepam (VALIUM) 5 MG tablet Take 1 tablet (5 mg total) by mouth at bedtime as needed for muscle spasms. 10 tablet 0  . meloxicam (MOBIC) 15 MG tablet Take 1 tablet (15 mg total) by mouth daily. 30 tablet 0  . tiZANidine (ZANAFLEX) 4 MG tablet Take 1 tablet (4 mg total) by mouth every 6 (six) hours as needed for muscle spasms. 30 tablet 0   No current facility-administered medications on file prior to visit.   Family History  Problem Relation Age of Onset  . Stroke Father   . Parkinson's disease Father    Social History   Social History  . Marital Status: Single    Spouse Name: N/A  . Number of Children: N/A  . Years of Education: N/A   Occupational History  . Not on file.   Social  History Main Topics  . Smoking status: Current Every Day Smoker -- 1.00 packs/day    Types: Cigarettes  . Smokeless tobacco: Not on file  . Alcohol Use: Yes     Comment: "a 12 pack a week"  . Drug Use: No  . Sexual Activity: Yes   Other Topics Concern  . Not on file   Social History Narrative    Review of Systems: Constitutional: Negative for fever, chills, diaphoresis, activity change, appetite change and fatigue. HENT: Negative for ear pain, nosebleeds, congestion, facial swelling, rhinorrhea, neck pain, neck stiffness and ear discharge.  Eyes: Negative for pain, discharge, redness, itching and visual disturbance. Respiratory: Negative for cough, choking, chest tightness, shortness of breath, wheezing and stridor.  Cardiovascular: Negative for chest pain, palpitations and leg swelling. Gastrointestinal: Negative for abdominal distention. Genitourinary: Negative for dysuria, urgency, frequency, hematuria, flank pain, decreased urine volume, difficulty urinating and dyspareunia.  Musculoskeletal: see hpi Neurological: Negative for dizziness, tremors, seizures, syncope, facial asymmetry, speech difficulty, weakness, light-headedness, numbness and headaches.  Hematological: Negative for adenopathy. Does not bruise/bleed easily. Psychiatric/Behavioral: Negative for hallucinations, behavioral problems, confusion, dysphoric mood, decreased concentration and agitation.    Objective: Filed Vitals:   04/25/15 1105 04/25/15 1109  BP: 167/117 146/82  Pulse: 82   Temp: 98.3 F (36.8 C)   TempSrc: Oral   Resp: 16   Height:  5' 10.5" (1.791 m)   Weight: 259 lb (117.482 kg)   SpO2: 94%       Filed Vitals:   04/25/15 1109  BP: 146/82  Pulse:   Temp:   Resp:     Physical Exam: Constitutional: Patient appears well-developed and well-nourished. No distress. HENT: Normocephalic, atraumatic, External right and left ear normal. Oropharynx is clear and moist.  Eyes: Conjunctivae and  EOM are normal. PERRLA, no scleral icterus. Neck: Normal ROM. Neck supple. No JVD. No tracheal deviation. No thyromegaly. CVS: RRR, S1/S2 +, no murmurs, no gallops, no carotid bruit.  Pulmonary: Effort and breath sounds normal, no stridor, rhonchi, wheezes, rales.  Abdominal: Soft. BS +,  no distension, tenderness, rebound or guarding.  Musculoskeletal: LLE in a cast; tenderness on palpation of Right lumbar paraspinal region, positive straight leg raise on the right. Lymphadenopathy: No lymphadenopathy noted, cervical, inguinal or axillary Neuro: Alert. Normal reflexes, muscle tone coordination. No cranial nerve deficit. Skin: Skin is warm and dry. No rash noted. Not diaphoretic. No erythema. No pallor. Psychiatric: Normal mood and affect. Behavior, judgment, thought content normal.     Assessment and plan:  Left fibular fracture: Cast in place Referred to Orthopedics for definitive management.  Right sided low back pain with Sciatica: Added tramadol to regimen Demonstrated gentle stretches Advised to apply heat Continue NSAID, muscle relaxant.     Elevates Blood Pressure: No known diagnosis of Hypertension Use of NSAIDS could be contributory as well as pain Advised on lifestyle modifications, low sodium and DASH diet Will hold off on antihypertensive and reassess BP at his next visit.  Jaclyn ShaggyEnobong, Amao, MD. [facility] [phone] 04/25/2015, 12:00 PM

## 2015-04-26 DIAGNOSIS — M545 Low back pain, unspecified: Secondary | ICD-10-CM | POA: Insufficient documentation

## 2015-05-01 ENCOUNTER — Telehealth: Payer: Self-pay | Admitting: Family Medicine

## 2015-05-01 NOTE — Telephone Encounter (Signed)
Pt. girlfriend called stating that PCP referred pt. To a Orthopedic. Pt. Has not received a call letting him know when his app. Is. Pt. Received his [employer] discount on the mail yesterday and has the orange car. Please f/u with pt.

## 2015-05-06 ENCOUNTER — Ambulatory Visit: Payer: Self-pay | Attending: Family Medicine | Admitting: Family Medicine

## 2015-05-06 ENCOUNTER — Telehealth: Payer: Self-pay | Admitting: Family Medicine

## 2015-05-06 ENCOUNTER — Encounter: Payer: Self-pay | Admitting: Family Medicine

## 2015-05-06 VITALS — BP 163/112 | HR 110 | Temp 97.8°F | Resp 18 | Ht 67.5 in | Wt 259.0 lb

## 2015-05-06 DIAGNOSIS — Z0000 Encounter for general adult medical examination without abnormal findings: Secondary | ICD-10-CM

## 2015-05-06 DIAGNOSIS — Z6839 Body mass index (BMI) 39.0-39.9, adult: Secondary | ICD-10-CM | POA: Insufficient documentation

## 2015-05-06 DIAGNOSIS — S82402D Unspecified fracture of shaft of left fibula, subsequent encounter for closed fracture with routine healing: Secondary | ICD-10-CM | POA: Insufficient documentation

## 2015-05-06 DIAGNOSIS — X58XXXD Exposure to other specified factors, subsequent encounter: Secondary | ICD-10-CM | POA: Insufficient documentation

## 2015-05-06 DIAGNOSIS — E669 Obesity, unspecified: Secondary | ICD-10-CM | POA: Insufficient documentation

## 2015-05-06 DIAGNOSIS — F17210 Nicotine dependence, cigarettes, uncomplicated: Secondary | ICD-10-CM | POA: Insufficient documentation

## 2015-05-06 DIAGNOSIS — M5441 Lumbago with sciatica, right side: Secondary | ICD-10-CM | POA: Insufficient documentation

## 2015-05-06 DIAGNOSIS — I10 Essential (primary) hypertension: Secondary | ICD-10-CM | POA: Insufficient documentation

## 2015-05-06 DIAGNOSIS — Z23 Encounter for immunization: Secondary | ICD-10-CM | POA: Insufficient documentation

## 2015-05-06 MED ORDER — METHOCARBAMOL 750 MG PO TABS
750.0000 mg | ORAL_TABLET | Freq: Two times a day (BID) | ORAL | Status: DC | PRN
Start: 2015-05-06 — End: 2015-05-30

## 2015-05-06 MED ORDER — METOPROLOL SUCCINATE ER 50 MG PO TB24: 50.0000 mg | ORAL_TABLET | Freq: Every day | ORAL | Status: DC

## 2015-05-06 MED ORDER — TRAMADOL HCL 50 MG PO TABS: 50.0000 mg | ORAL_TABLET | Freq: Four times a day (QID) | ORAL | Status: DC | PRN

## 2015-05-06 NOTE — Telephone Encounter (Signed)
Patient dropped off [employer] discount to be revised by Arna MediciNora in order for his referral to be completed. Please follow up with pt. Thank you.

## 2015-05-06 NOTE — Progress Notes (Signed)
Subjective:    Patient ID: Daniel Hanson, male    DOB: [birth year], 49 y.o.   MRN: [id number]  HPI 49 year old male with left fibular fracture awaiting appointment with orthopedics who was also noted to have elevated blood pressure at his last office visit with no prior diagnosis of hypertension. He also had low back pain after lifting his dad a couple of weeks ago for which he had been taking muscle relaxants, NSAIDs and tramadol and reports minimal improvement in symptoms. Pain radiates down his right buttock to his right thigh.  He informs me he now has [employer] card and [employer] which will enable him to see orthopedics. Back pain is intermittent and he has a hard time maintaining a comfortable position in bed.  Past Medical History  Diagnosis Date  . Neck fracture (HCC) 1986    History reviewed. No pertinent past surgical history.  Social History   Social History  . Marital Status: Single    Spouse Name: N/A  . Number of Children: N/A  . Years of Education: N/A   Occupational History  . Not on file.   Social History Main Topics  . Smoking status: Current Every Day Smoker -- 1.00 packs/day    Types: Cigarettes  . Smokeless tobacco: Not on file  . Alcohol Use: Yes     Comment: "a 12 pack a week"  . Drug Use: No  . Sexual Activity: Yes   Other Topics Concern  . Not on file   Social History Narrative    No Known Allergies     Review of Systems Constitutional: Negative for fever, chills, diaphoresis, activity change, appetite change and fatigue. HENT: Negative for ear pain, nosebleeds, congestion, facial swelling, rhinorrhea, neck pain, neck stiffness and ear discharge.  Eyes: Negative for pain, discharge, redness, itching and visual disturbance. Respiratory: Negative for cough, choking, chest tightness, shortness of breath, wheezing and stridor.  Cardiovascular: Negative for chest pain, palpitations and leg swelling. Gastrointestinal: Negative  for abdominal distention. Genitourinary: Negative for dysuria, urgency, frequency, hematuria, flank pain, decreased urine volume, difficulty urinating and dyspareunia.  Musculoskeletal: see hpi Neurological: Negative for dizziness, tremors, seizures, syncope, facial asymmetry, speech difficulty, weakness, light-headedness, numbness and headaches.  Hematological: Negative for adenopathy. Does not bruise/bleed easily. Psychiatric/Behavioral: Negative for hallucinations, behavioral problems, confusion, dysphoric mood, decreased concentration and agitation.    Objective: Filed Vitals:   05/06/15 1529 05/06/15 1532  BP:  163/112  Pulse:  110  Temp:  97.8 F (36.6 C)  TempSrc:  Oral  Resp:  18  Height: 5' 7.5" (1.715 m)   Weight: 259 lb (117.482 kg)   SpO2:  95%      Physical Exam Constitutional: Patient appears well-developed and well-nourished. No distress. Neck: Normal ROM. Neck supple. No JVD. No tracheal deviation. No thyromegaly. CVS: Tachycardic rate, regular rhythm, S1/S2 +, no murmurs, no gallops, no carotid bruit.  Pulmonary: Effort and breath sounds normal, no stridor, rhonchi, wheezes, rales.  Abdominal: Soft. BS +,  no distension, tenderness, rebound or guarding.  Musculoskeletal: LLE in a cast with 2+ nonpitting pedal edema of the left lower extremity; tenderness on palpation of Right lumbar paraspinal region, positive straight leg raise on the right. Lymphadenopathy: No lymphadenopathy noted, cervical, inguinal or axillary Neuro: Alert. Normal reflexes, muscle tone coordination. No cranial nerve deficit. Skin: Skin is warm and dry. No rash noted. Not diaphoretic. No erythema. No pallor. Psychiatric: Normal mood and affect. Behavior, judgment, thought content normal.  Assessment & Plan:  Left fibular fracture: Cast in place Referred to Orthopedics for definitive management; [facility] and I have sent a message to the referral coordinator so referral  can be placed.  Right sided low back pain with Sciatica: Added Robaxin meanwhile continue tramadol to regimen Demonstrated gentle stretches Advised to apply heat  Hypertension: New diagnosis. Have placed him on metoprolol especially since he has got some tachycardia.    Use of NSAIDS could be contributory as well as pain Advised on lifestyle modifications, low sodium and DASH diet  Obesity: At this stage he is unable to exercise much due to his left femur fracture and low back pain. He has been advised on cutting back on his portion as much as possible.  This note has been created with Education officer, environmentalDragon speech recognition software and smart phrase technology. Any transcriptional errors are unintentional.

## 2015-05-06 NOTE — Patient Instructions (Signed)
Hypertension Hypertension, commonly called high blood pressure, is when the force of blood pumping through your arteries is too strong. Your arteries are the blood vessels that carry blood from your heart throughout your body. A blood pressure reading consists of a higher number over a lower number, such as 110/72. The higher number (systolic) is the pressure inside your arteries when your heart pumps. The lower number (diastolic) is the pressure inside your arteries when your heart relaxes. Ideally you want your blood pressure below 120/80. Hypertension forces your heart to work harder to pump blood. Your arteries may become narrow or stiff. Having untreated or uncontrolled hypertension can cause heart attack, stroke, kidney disease, and other problems. RISK FACTORS Some risk factors for high blood pressure are controllable. Others are not.  Risk factors you cannot control include:   Race. You may be at higher risk if you are African American.  Age. Risk increases with age.  Gender. Men are at higher risk than women before age 45 years. After age 65, women are at higher risk than men. Risk factors you can control include:  Not getting enough exercise or physical activity.  Being overweight.  Getting too much fat, sugar, calories, or salt in your diet.  Drinking too much alcohol. SIGNS AND SYMPTOMS Hypertension does not usually cause signs or symptoms. Extremely high blood pressure (hypertensive crisis) may cause headache, anxiety, shortness of breath, and nosebleed. DIAGNOSIS To check if you have hypertension, your health care provider will measure your blood pressure while you are seated, with your arm held at the level of your heart. It should be measured at least twice using the same arm. Certain conditions can cause a difference in blood pressure between your right and left arms. A blood pressure reading that is higher than normal on one occasion does not mean that you need treatment. If  it is not clear whether you have high blood pressure, you may be asked to return on a different day to have your blood pressure checked again. Or, you may be asked to monitor your blood pressure at home for 1 or more weeks. TREATMENT Treating high blood pressure includes making lifestyle changes and possibly taking medicine. Living a healthy lifestyle can help lower high blood pressure. You may need to change some of your habits. Lifestyle changes may include:  Following the DASH diet. This diet is high in fruits, vegetables, and whole grains. It is low in salt, red meat, and added sugars.  Keep your sodium intake below 2,300 mg per day.  Getting at least 30-45 minutes of aerobic exercise at least 4 times per week.  Losing weight if necessary.  Not smoking.  Limiting alcoholic beverages.  Learning ways to reduce stress. Your health care provider may prescribe medicine if lifestyle changes are not enough to get your blood pressure under control, and if one of the following is true:  You are 18-59 years of age and your systolic blood pressure is above 140.  You are 60 years of age or older, and your systolic blood pressure is above 150.  Your diastolic blood pressure is above 90.  You have diabetes, and your systolic blood pressure is over 140 or your diastolic blood pressure is over 90.  You have kidney disease and your blood pressure is above 140/90.  You have heart disease and your blood pressure is above 140/90. Your personal target blood pressure may vary depending on your medical conditions, your age, and other factors. HOME CARE INSTRUCTIONS    Have your blood pressure rechecked as directed by your health care provider.   Take medicines only as directed by your health care provider. Follow the directions carefully. Blood pressure medicines must be taken as prescribed. The medicine does not work as well when you skip doses. Skipping doses also puts you at risk for  problems.  Do not smoke.   Monitor your blood pressure at home as directed by your health care provider. SEEK MEDICAL CARE IF:   You think you are having a reaction to medicines taken.  You have recurrent headaches or feel dizzy.  You have swelling in your ankles.  You have trouble with your vision. SEEK IMMEDIATE MEDICAL CARE IF:  You develop a severe headache or confusion.  You have unusual weakness, numbness, or feel faint.  You have severe chest or abdominal pain.  You vomit repeatedly.  You have trouble breathing. MAKE SURE YOU:   Understand these instructions.  Will watch your condition.  Will get help right away if you are not doing well or get worse.   This information is not intended to replace advice given to you by your health care provider. Make sure you discuss any questions you have with your health care provider.   Document Released: 06/29/2005 Document Revised: 11/13/2014 Document Reviewed: 04/21/2013 Elsevier Interactive Patient Education 2016 Elsevier Inc.  

## 2015-05-06 NOTE — Progress Notes (Signed)
Pt's here for f/up chronic back pain, and HTN. Pt rate back pain at level 8. Pt states that the pain is not getting any better.  Pt wants to est. care with PCP. Pt requesting something to help him sleep.

## 2015-05-06 NOTE — Telephone Encounter (Signed)
Patient forgot to mention that he is also needing a refill for diazepam (VALIUM) 5 MG tablet. Please follow up with patient.

## 2015-05-07 NOTE — Telephone Encounter (Signed)
Done  Thank you  [facility] Ph# [phone] address [address]. They will contact the patient for an appointment

## 2015-05-08 ENCOUNTER — Ambulatory Visit: Payer: Self-pay | Admitting: Internal Medicine

## 2015-05-08 NOTE — Telephone Encounter (Signed)
CMA called pt, pt verified name and DOB. I addressed medication refill and told him that i would have to obtain approval from the Provider. Pt stated that he understood.

## 2015-05-09 ENCOUNTER — Telehealth: Payer: Self-pay

## 2015-05-09 NOTE — Telephone Encounter (Signed)
-----   Message from Jaclyn ShaggyEnobong Amao, MD sent at 05/08/2015  5:03 PM EDT ----- Denied. I had a discussion with him about the medications I would be keeping him on at his last visit.  ----- Message -----    From: Earnestine LeysKimberly L Bennett-Curse, CMA    Sent: 05/08/2015   4:52 PM      To: Jaclyn ShaggyEnobong Amao, MD  Pt called back requesting rx of diazepam 5mg .  Thanks!

## 2015-05-09 NOTE — Telephone Encounter (Signed)
CMA called pt, pt verified name and DOB. Pt was given the message about denying refill of pt diazepam 5mg . Pt verbalized that he understood with no further questions.

## 2015-05-17 ENCOUNTER — Ambulatory Visit: Payer: Self-pay | Admitting: Family Medicine

## 2015-05-24 ENCOUNTER — Ambulatory Visit: Payer: Self-pay | Admitting: Family Medicine

## 2015-05-30 ENCOUNTER — Ambulatory Visit: Payer: Self-pay | Attending: Family Medicine | Admitting: Family Medicine

## 2015-05-30 ENCOUNTER — Encounter: Payer: Self-pay | Admitting: Family Medicine

## 2015-05-30 ENCOUNTER — Other Ambulatory Visit: Payer: Self-pay

## 2015-05-30 VITALS — BP 136/94 | HR 78 | Temp 98.7°F | Resp 16 | Ht 70.5 in | Wt 258.0 lb

## 2015-05-30 DIAGNOSIS — M545 Low back pain: Secondary | ICD-10-CM | POA: Insufficient documentation

## 2015-05-30 DIAGNOSIS — F17210 Nicotine dependence, cigarettes, uncomplicated: Secondary | ICD-10-CM | POA: Insufficient documentation

## 2015-05-30 DIAGNOSIS — I10 Essential (primary) hypertension: Secondary | ICD-10-CM | POA: Insufficient documentation

## 2015-05-30 DIAGNOSIS — Z79899 Other long term (current) drug therapy: Secondary | ICD-10-CM | POA: Insufficient documentation

## 2015-05-30 DIAGNOSIS — S82402D Unspecified fracture of shaft of left fibula, subsequent encounter for closed fracture with routine healing: Secondary | ICD-10-CM

## 2015-05-30 DIAGNOSIS — E669 Obesity, unspecified: Secondary | ICD-10-CM | POA: Insufficient documentation

## 2015-05-30 DIAGNOSIS — M5441 Lumbago with sciatica, right side: Secondary | ICD-10-CM | POA: Insufficient documentation

## 2015-05-30 MED ORDER — METHOCARBAMOL 750 MG PO TABS: 750.0000 mg | ORAL_TABLET | Freq: Two times a day (BID) | ORAL | Status: DC | PRN

## 2015-05-30 NOTE — Progress Notes (Signed)
Pt's here for f/up HTN. Pt feeling a lot better since last visit.   Pt reports feeling like his ears are clogged.

## 2015-05-30 NOTE — Patient Instructions (Signed)
Hypertension Hypertension, commonly called high blood pressure, is when the force of blood pumping through your arteries is too strong. Your arteries are the blood vessels that carry blood from your heart throughout your body. A blood pressure reading consists of a higher number over a lower number, such as 110/72. The higher number (systolic) is the pressure inside your arteries when your heart pumps. The lower number (diastolic) is the pressure inside your arteries when your heart relaxes. Ideally you want your blood pressure below 120/80. Hypertension forces your heart to work harder to pump blood. Your arteries may become narrow or stiff. Having untreated or uncontrolled hypertension can cause heart attack, stroke, kidney disease, and other problems. RISK FACTORS Some risk factors for high blood pressure are controllable. Others are not.  Risk factors you cannot control include:   Race. You may be at higher risk if you are African American.  Age. Risk increases with age.  Gender. Men are at higher risk than women before age 45 years. After age 65, women are at higher risk than men. Risk factors you can control include:  Not getting enough exercise or physical activity.  Being overweight.  Getting too much fat, sugar, calories, or salt in your diet.  Drinking too much alcohol. SIGNS AND SYMPTOMS Hypertension does not usually cause signs or symptoms. Extremely high blood pressure (hypertensive crisis) may cause headache, anxiety, shortness of breath, and nosebleed. DIAGNOSIS To check if you have hypertension, your health care provider will measure your blood pressure while you are seated, with your arm held at the level of your heart. It should be measured at least twice using the same arm. Certain conditions can cause a difference in blood pressure between your right and left arms. A blood pressure reading that is higher than normal on one occasion does not mean that you need treatment. If  it is not clear whether you have high blood pressure, you may be asked to return on a different day to have your blood pressure checked again. Or, you may be asked to monitor your blood pressure at home for 1 or more weeks. TREATMENT Treating high blood pressure includes making lifestyle changes and possibly taking medicine. Living a healthy lifestyle can help lower high blood pressure. You may need to change some of your habits. Lifestyle changes may include:  Following the DASH diet. This diet is high in fruits, vegetables, and whole grains. It is low in salt, red meat, and added sugars.  Keep your sodium intake below 2,300 mg per day.  Getting at least 30-45 minutes of aerobic exercise at least 4 times per week.  Losing weight if necessary.  Not smoking.  Limiting alcoholic beverages.  Learning ways to reduce stress. Your health care provider may prescribe medicine if lifestyle changes are not enough to get your blood pressure under control, and if one of the following is true:  You are 18-59 years of age and your systolic blood pressure is above 140.  You are 60 years of age or older, and your systolic blood pressure is above 150.  Your diastolic blood pressure is above 90.  You have diabetes, and your systolic blood pressure is over 140 or your diastolic blood pressure is over 90.  You have kidney disease and your blood pressure is above 140/90.  You have heart disease and your blood pressure is above 140/90. Your personal target blood pressure may vary depending on your medical conditions, your age, and other factors. HOME CARE INSTRUCTIONS    Have your blood pressure rechecked as directed by your health care provider.   Take medicines only as directed by your health care provider. Follow the directions carefully. Blood pressure medicines must be taken as prescribed. The medicine does not work as well when you skip doses. Skipping doses also puts you at risk for  problems.  Do not smoke.   Monitor your blood pressure at home as directed by your health care provider. SEEK MEDICAL CARE IF:   You think you are having a reaction to medicines taken.  You have recurrent headaches or feel dizzy.  You have swelling in your ankles.  You have trouble with your vision. SEEK IMMEDIATE MEDICAL CARE IF:  You develop a severe headache or confusion.  You have unusual weakness, numbness, or feel faint.  You have severe chest or abdominal pain.  You vomit repeatedly.  You have trouble breathing. MAKE SURE YOU:   Understand these instructions.  Will watch your condition.  Will get help right away if you are not doing well or get worse.   This information is not intended to replace advice given to you by your health care provider. Make sure you discuss any questions you have with your health care provider.   Document Released: 06/29/2005 Document Revised: 11/13/2014 Document Reviewed: 04/21/2013 Elsevier Interactive Patient Education 2016 Elsevier Inc.  
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
## 2016-11-20 ENCOUNTER — Ambulatory Visit (HOSPITAL_COMMUNITY)
Admission: EM | Admit: 2016-11-20 | Discharge: 2016-11-20 | Disposition: A | Discharge status: Home / Self Care | Payer: Self-pay | Attending: Internal Medicine | Admitting: Internal Medicine

## 2016-11-20 ENCOUNTER — Ambulatory Visit (INDEPENDENT_AMBULATORY_CARE_PROVIDER_SITE_OTHER): Payer: Self-pay

## 2016-11-20 ENCOUNTER — Encounter (HOSPITAL_COMMUNITY): Payer: Self-pay | Admitting: *Deleted

## 2016-11-20 DIAGNOSIS — L03031 Cellulitis of right toe: Secondary | ICD-10-CM

## 2016-11-20 DIAGNOSIS — S92421A Displaced fracture of distal phalanx of right great toe, initial encounter for closed fracture: Secondary | ICD-10-CM

## 2016-11-20 MED ORDER — DOXYCYCLINE HYCLATE 100 MG PO CAPS: 100.0000 mg | ORAL_CAPSULE | Freq: Two times a day (BID) | ORAL | 0 refills | Status: DC

## 2016-11-20 NOTE — Discharge Instructions (Signed)
For your cellulitis, I have prescribed doxycycline, take one tablet twice a day.  Your toe is fractured, and it is displaced, this will need follow-up with orthopedics. We've placed your foot in a postop boot, recommend wearing it at all times, however you may walk with it. He may take over-the-counter Tylenol, or ibuprofen as needed for pain.
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
## 2017-03-14 ENCOUNTER — Emergency Department (HOSPITAL_COMMUNITY): Payer: Self-pay

## 2017-03-14 ENCOUNTER — Ambulatory Visit (HOSPITAL_COMMUNITY)
Admission: EM | Admit: 2017-03-14 | Discharge: 2017-03-14 | Disposition: A | Discharge status: Home / Self Care | Payer: Self-pay | Attending: Family Medicine | Admitting: Family Medicine

## 2017-03-14 ENCOUNTER — Emergency Department (HOSPITAL_COMMUNITY)
Admission: EM | Admit: 2017-03-14 | Discharge: 2017-03-14 | Disposition: A | Discharge status: Home / Self Care | Payer: Self-pay | Attending: Emergency Medicine | Admitting: Emergency Medicine

## 2017-03-14 ENCOUNTER — Encounter (HOSPITAL_COMMUNITY): Payer: Self-pay | Admitting: *Deleted

## 2017-03-14 ENCOUNTER — Encounter (HOSPITAL_COMMUNITY): Payer: Self-pay

## 2017-03-14 DIAGNOSIS — F17210 Nicotine dependence, cigarettes, uncomplicated: Secondary | ICD-10-CM | POA: Insufficient documentation

## 2017-03-14 DIAGNOSIS — I10 Essential (primary) hypertension: Secondary | ICD-10-CM | POA: Insufficient documentation

## 2017-03-14 DIAGNOSIS — L03115 Cellulitis of right lower limb: Secondary | ICD-10-CM

## 2017-03-14 DIAGNOSIS — T148XXD Other injury of unspecified body region, subsequent encounter: Secondary | ICD-10-CM

## 2017-03-14 LAB — COMPREHENSIVE METABOLIC PANEL
ALBUMIN: 3.9 g/dL (ref 3.5–5.0)
ALT: 28 U/L (ref 17–63)
AST: 23 U/L (ref 15–41)
Alkaline Phosphatase: 68 U/L (ref 38–126)
Anion gap: 9 (ref 5–15)
BUN: 9 mg/dL (ref 6–20)
CHLORIDE: 102 mmol/L (ref 101–111)
CO2: 27 mmol/L (ref 22–32)
CREATININE: 0.76 mg/dL (ref 0.61–1.24)
Calcium: 8.8 mg/dL — ABNORMAL LOW (ref 8.9–10.3)
GFR calc Af Amer: >60 mL/min (ref >60)
GLUCOSE: 89 mg/dL (ref 65–99)
POTASSIUM: 4.3 mmol/L (ref 3.5–5.1)
Sodium: 138 mmol/L (ref 135–145)
Total Bilirubin: 0.4 mg/dL (ref 0.3–1.2)
Total Protein: 7 g/dL (ref 6.5–8.1)

## 2017-03-14 LAB — CBC WITH DIFFERENTIAL/PLATELET
Basophils Absolute: 0.1 10*3/uL (ref 0.0–0.1)
Basophils Relative: 1 %
EOS ABS: 0.4 10*3/uL (ref 0.0–0.7)
EOS PCT: 4 %
HCT: 44.7 % (ref 39.0–52.0)
Hemoglobin: 15.2 g/dL (ref 13.0–17.0)
LYMPHS ABS: 2.3 10*3/uL (ref 0.7–4.0)
LYMPHS PCT: 24 %
MCH: 32.2 pg (ref 26.0–34.0)
MCHC: 34 g/dL (ref 30.0–36.0)
MCV: 94.7 fL (ref 78.0–100.0)
MONO ABS: 0.8 10*3/uL (ref 0.1–1.0)
MONOS PCT: 8 %
Neutro Abs: 6.3 10*3/uL (ref 1.7–7.7)
Neutrophils Relative %: 63 %
PLATELETS: 220 10*3/uL (ref 150–400)
RBC: 4.72 MIL/uL (ref 4.22–5.81)
RDW: 13.5 % (ref 11.5–15.5)
WBC: 9.8 10*3/uL (ref 4.0–10.5)

## 2017-03-14 LAB — I-STAT CG4 LACTIC ACID, ED: LACTIC ACID, VENOUS: 1.61 mmol/L (ref 0.5–1.9)

## 2017-03-14 MED ORDER — HYDROCODONE-ACETAMINOPHEN 5-325 MG PO TABS
1.0000 | ORAL_TABLET | Freq: Once | ORAL | Status: AC
  Administered 2017-03-14: 1 via ORAL
  Filled 2017-03-14: qty 1

## 2017-03-14 MED ORDER — SULFAMETHOXAZOLE-TRIMETHOPRIM 800-160 MG PO TABS: 1.0000 | ORAL_TABLET | Freq: Two times a day (BID) | ORAL | 0 refills | Status: AC

## 2017-03-14 MED ORDER — CEPHALEXIN 500 MG PO CAPS: 500.0000 mg | ORAL_CAPSULE | Freq: Four times a day (QID) | ORAL | 0 refills | Status: AC
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
## 2017-03-14 NOTE — Discharge Instructions (Signed)
Go to ER

## 2017-03-14 NOTE — ED Notes (Addendum)
Patient sent down to ED for eval of infection to right foot, patient and family verbalized understanding

## 2017-03-14 NOTE — ED Provider Notes (Signed)
  [facility]    CSN: [id number] Arrival date & time: 03/14/17  1448   History   Chief Complaint Chief Complaint  Patient presents with  . Wound Infection   HPI   Review of Systems Review of Systems  Constitutional: Positive for chills.     Physical Exam Triage Vital Signs ED Triage Vitals [03/14/17 1614]  Enc Vitals Group     BP 133/78     Pulse Rate (!) 101     Resp 17     Temp 99.1 F (37.3 C)     Temp Source Oral     SpO2 98 %   Updated Vital Signs BP 133/78 (BP Location: Left Arm)   Pulse (!) 101   Temp 99.1 F (37.3 C) (Oral)   Resp 17   SpO2 98%   Physical Exam  Procedures Procedures   Initial Impression / Assessment and Plan / UC Course   Pt with an apparent cellulitis and poorly healing wound on the R foot. There is reported foul odor and chills. Due to the latter issue, I suggested he follow up in the ED for further evaluation. He and his wife voiced understanding and agreement. No charge for visit. Transportation was offered and declined.      Sharlene DoryWendling, Maxtyn Nuzum Paul, [place] 03/14/17 2150

## 2017-03-14 NOTE — ED Notes (Signed)
Pt understood dc material. NAD noted. Scripts given at dc 
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
## 2017-03-14 NOTE — Discharge Instructions (Addendum)
IMPORTANT PATIENT INSTRUCTIONS:  You have been scheduled for an Outpatient Vascular Study at [facility].    If tomorrow is a Saturday, Sunday or holiday, please go to the [facility] Emergency Department Registration Desk at 8 am tomorrow morning and tell them you are there for a vascular study.  If tomorrow is a weekday (Monday-Friday), please go to [facility] Ad[facility] at 8 am and tell them you are there for a vascular study.  Please read instructions below. Begin taking the antibiotic, Keflex, 4 times per day until it is gone. Begin taking the antibiotic, Bactrim, 2 times per day until gone. You can continue taking Aleve as needed for pain.  It is important to schedule an appointment with the wound care clinic for follow-up on your foot wound as well as cellulitis. I have given you a primary care referral, to establish care for regular checkups. Return to the ER immediately for fever, pus draining from your wound, severely worsening redness, or new or concerning symptoms.
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
## 2017-03-15 ENCOUNTER — Ambulatory Visit (HOSPITAL_COMMUNITY)
Admission: RE | Admit: 2017-03-15 | Discharge: 2017-03-15 | Disposition: A | Discharge status: Home / Self Care | Payer: Self-pay | Source: Ambulatory Visit | Attending: Emergency Medicine | Admitting: Emergency Medicine

## 2017-03-15 DIAGNOSIS — M79604 Pain in right leg: Secondary | ICD-10-CM | POA: Insufficient documentation

## 2017-03-15 DIAGNOSIS — M7989 Other specified soft tissue disorders: Secondary | ICD-10-CM

## 2017-03-15 NOTE — Progress Notes (Signed)
*  PRELIMINARY RESULTS* Vascular Ultrasound Right lower extremity venous duplex has been completed.  Preliminary findings: No evidence of DVT or baker's cyst.    Farrel DemarkJill Eunice, RDMS, RVT  03/15/2017, 8:38 AM

## 2017-06-22 ENCOUNTER — Other Ambulatory Visit: Payer: Self-pay | Admitting: Family Medicine

## 2017-06-22 DIAGNOSIS — R202 Paresthesia of skin: Secondary | ICD-10-CM

## 2017-06-26 ENCOUNTER — Other Ambulatory Visit: Payer: Self-pay

## 2017-07-30 ENCOUNTER — Encounter: Payer: Self-pay | Admitting: Podiatry

## 2017-07-30 ENCOUNTER — Ambulatory Visit: Payer: BLUE CROSS/BLUE SHIELD | Admitting: Podiatry

## 2017-07-30 ENCOUNTER — Ambulatory Visit (INDEPENDENT_AMBULATORY_CARE_PROVIDER_SITE_OTHER): Payer: BLUE CROSS/BLUE SHIELD

## 2017-07-30 VITALS — BP 136/78 | HR 52

## 2017-07-30 DIAGNOSIS — L601 Onycholysis: Secondary | ICD-10-CM

## 2017-07-30 DIAGNOSIS — S92421K Displaced fracture of distal phalanx of right great toe, subsequent encounter for fracture with nonunion: Secondary | ICD-10-CM

## 2017-07-30 DIAGNOSIS — T1490XA Injury, unspecified, initial encounter: Secondary | ICD-10-CM

## 2017-07-30 NOTE — Patient Instructions (Signed)
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
## 2017-08-06 ENCOUNTER — Encounter: Payer: Self-pay | Admitting: Podiatry

## 2017-08-06 ENCOUNTER — Ambulatory Visit: Payer: BLUE CROSS/BLUE SHIELD | Admitting: Podiatry

## 2017-08-06 DIAGNOSIS — L600 Ingrowing nail: Secondary | ICD-10-CM

## 2017-08-06 DIAGNOSIS — S92421K Displaced fracture of distal phalanx of right great toe, subsequent encounter for fracture with nonunion: Secondary | ICD-10-CM

## 2017-08-06 DIAGNOSIS — M79676 Pain in unspecified toe(s): Secondary | ICD-10-CM

## 2017-08-06 DIAGNOSIS — S92911K Unspecified fracture of right toe(s), subsequent encounter for fracture with nonunion: Secondary | ICD-10-CM | POA: Diagnosis not present

## 2017-08-06 MED ORDER — IBUPROFEN 800 MG PO TABS: 800.0000 mg | ORAL_TABLET | Freq: Three times a day (TID) | ORAL | 0 refills | Status: DC | PRN

## 2017-08-10 NOTE — Progress Notes (Signed)
  Subjective:  Patient ID: Daniel Hanson, male    DOB: October 17, 1965,  MRN: [id number]  No chief complaint on file.  52 y.o. male returns for the above complaint.  States that the toe is still hurting however the redness and swelling in the toe went down considerably after removal of his toenail.  Objective:  There were no vitals filed for this visit. General AA&O x3. Normal mood and affect.  Vascular Pedal pulses palpable.  Neurologic Epicritic sensation grossly intact.  Dermatologic No open lesions. Skin normal texture and turgor.  Nail avulsion site healing well without  Orthopedic: No pain to palpation either foot.   Assessment & Plan:  Patient was evaluated and treated and all questions answered.  Right great toe distal phalanx fracture dislocation -Would consider interphalangeal fusion once the nail bed fully healed.  The patient that he would likely have to wear a boot on his foot for several weeks.  Patient states that he cannot do that with his current work schedule he will figure out a time when he can have the surgery performed.  We will further discuss at next visit.  Status post right hallux total nail avulsion -Avulsion site healing well without any evidence of erythema or drainage.  Redness is resolved.  15 minutes of face to face time were spent with the patient. >50% of this was spent on counseling and coordination of care. Specifically discussed with patient the above diagnoses and treatment plans.   Return in about 2 weeks (around 08/20/2017) for  f/u.

## 2017-08-20 ENCOUNTER — Ambulatory Visit: Payer: BLUE CROSS/BLUE SHIELD | Admitting: Podiatry

## 2017-09-02 ENCOUNTER — Ambulatory Visit: Payer: BLUE CROSS/BLUE SHIELD | Admitting: Podiatry

## 2017-09-08 ENCOUNTER — Ambulatory Visit: Payer: BLUE CROSS/BLUE SHIELD | Admitting: Podiatry

## 2017-09-22 ENCOUNTER — Ambulatory Visit: Payer: BLUE CROSS/BLUE SHIELD | Admitting: Podiatry

## 2017-10-09 ENCOUNTER — Emergency Department (HOSPITAL_COMMUNITY): Payer: BLUE CROSS/BLUE SHIELD

## 2017-10-09 ENCOUNTER — Emergency Department (HOSPITAL_COMMUNITY): Payer: BLUE CROSS/BLUE SHIELD | Admitting: Certified Registered"

## 2017-10-09 ENCOUNTER — Encounter (HOSPITAL_COMMUNITY)
Admission: EM | Disposition: A | Discharge status: Home / Self Care | Payer: Self-pay | Source: Home / Self Care | Attending: Emergency Medicine

## 2017-10-09 ENCOUNTER — Observation Stay (HOSPITAL_COMMUNITY)
Admission: EM | Admit: 2017-10-09 | Discharge: 2017-10-10 | Disposition: A | Discharge status: Home / Self Care | Payer: BLUE CROSS/BLUE SHIELD | Attending: Orthopedic Surgery | Admitting: Orthopedic Surgery

## 2017-10-09 ENCOUNTER — Other Ambulatory Visit: Payer: Self-pay

## 2017-10-09 ENCOUNTER — Encounter (HOSPITAL_COMMUNITY): Payer: Self-pay | Admitting: Emergency Medicine

## 2017-10-09 DIAGNOSIS — I10 Essential (primary) hypertension: Secondary | ICD-10-CM | POA: Insufficient documentation

## 2017-10-09 DIAGNOSIS — L03116 Cellulitis of left lower limb: Secondary | ICD-10-CM | POA: Insufficient documentation

## 2017-10-09 DIAGNOSIS — M71162 Other infective bursitis, left knee: Secondary | ICD-10-CM | POA: Diagnosis present

## 2017-10-09 DIAGNOSIS — M7042 Prepatellar bursitis, left knee: Secondary | ICD-10-CM | POA: Diagnosis not present

## 2017-10-09 DIAGNOSIS — M25562 Pain in left knee: Secondary | ICD-10-CM

## 2017-10-09 DIAGNOSIS — F17210 Nicotine dependence, cigarettes, uncomplicated: Secondary | ICD-10-CM | POA: Diagnosis not present

## 2017-10-09 HISTORY — PX: IRRIGATION AND DEBRIDEMENT KNEE: SHX5185

## 2017-10-09 LAB — CBC WITH DIFFERENTIAL/PLATELET
BASOS ABS: 0 10*3/uL (ref 0.0–0.1)
BASOS PCT: 0 %
EOS ABS: 0.2 10*3/uL (ref 0.0–0.7)
EOS PCT: 1 %
HCT: 43.4 % (ref 39.0–52.0)
Hemoglobin: 14.8 g/dL (ref 13.0–17.0)
Lymphocytes Relative: 12 %
Lymphs Abs: 1.6 10*3/uL (ref 0.7–4.0)
MCH: 32.5 pg (ref 26.0–34.0)
MCHC: 34.1 g/dL (ref 30.0–36.0)
MCV: 95.4 fL (ref 78.0–100.0)
MONO ABS: 0.5 10*3/uL (ref 0.1–1.0)
MONOS PCT: 4 %
Neutro Abs: 10.8 10*3/uL — ABNORMAL HIGH (ref 1.7–7.7)
Neutrophils Relative %: 83 %
PLATELETS: 175 10*3/uL (ref 150–400)
RBC: 4.55 MIL/uL (ref 4.22–5.81)
RDW: 13.4 % (ref 11.5–15.5)
WBC: 13 10*3/uL — ABNORMAL HIGH (ref 4.0–10.5)

## 2017-10-09 LAB — I-STAT CG4 LACTIC ACID, ED: Lactic Acid, Venous: 2.9 mmol/L (ref 0.5–1.9)

## 2017-10-09 LAB — COMPREHENSIVE METABOLIC PANEL
ALT: 26 U/L (ref 17–63)
AST: 22 U/L (ref 15–41)
Albumin: 4.3 g/dL (ref 3.5–5.0)
Alkaline Phosphatase: 84 U/L (ref 38–126)
Anion gap: 10 (ref 5–15)
BUN: 15 mg/dL (ref 6–20)
CO2: 22 mmol/L (ref 22–32)
Calcium: 8.7 mg/dL — ABNORMAL LOW (ref 8.9–10.3)
Chloride: 103 mmol/L (ref 101–111)
Creatinine, Ser: 0.74 mg/dL (ref 0.61–1.24)
GFR calc Af Amer: >60 mL/min (ref >60)
GFR calc non Af Amer: >60 mL/min (ref >60)
Glucose, Bld: 188 mg/dL — ABNORMAL HIGH (ref 65–99)
Potassium: 3.8 mmol/L (ref 3.5–5.1)
Sodium: 135 mmol/L (ref 135–145)
Total Bilirubin: 0.7 mg/dL (ref 0.3–1.2)
Total Protein: 7.1 g/dL (ref 6.5–8.1)

## 2017-10-09 SURGERY — IRRIGATION AND DEBRIDEMENT KNEE
Anesthesia: General | Site: Knee | Laterality: Left

## 2017-10-09 MED ORDER — METOCLOPRAMIDE HCL 5 MG PO TABS: 5.0000 mg | ORAL_TABLET | Freq: Three times a day (TID) | ORAL | Status: DC | PRN

## 2017-10-09 MED ORDER — OXYCODONE HCL 5 MG PO TABS: 5.0000 mg | ORAL_TABLET | Freq: Once | ORAL | Status: DC | PRN

## 2017-10-09 MED ORDER — ONDANSETRON HCL 4 MG/2ML IJ SOLN: 4.0000 mg | Freq: Four times a day (QID) | INTRAMUSCULAR | Status: DC | PRN

## 2017-10-09 MED ORDER — DOCUSATE SODIUM 100 MG PO CAPS: 100.0000 mg | ORAL_CAPSULE | Freq: Two times a day (BID) | ORAL | Status: DC

## 2017-10-09 MED ORDER — HYDROMORPHONE HCL 1 MG/ML IJ SOLN
0.5000 mg | INTRAMUSCULAR | Status: DC | PRN
  Administered 2017-10-09: 1 mg via INTRAVENOUS
  Filled 2017-10-09: qty 1

## 2017-10-09 MED ORDER — MIDAZOLAM HCL 2 MG/2ML IJ SOLN
INTRAMUSCULAR | Status: AC
  Filled 2017-10-09: qty 2

## 2017-10-09 MED ORDER — METHOCARBAMOL 500 MG PO TABS
500.0000 mg | ORAL_TABLET | Freq: Four times a day (QID) | ORAL | Status: DC | PRN
  Administered 2017-10-10: 500 mg via ORAL
  Filled 2017-10-09: qty 1

## 2017-10-09 MED ORDER — PROMETHAZINE HCL 25 MG/ML IJ SOLN: 6.2500 mg | INTRAMUSCULAR | Status: DC | PRN

## 2017-10-09 MED ORDER — MEPERIDINE HCL 50 MG/ML IJ SOLN: 6.2500 mg | INTRAMUSCULAR | Status: DC | PRN

## 2017-10-09 MED ORDER — LIDOCAINE 2% (20 MG/ML) 5 ML SYRINGE
INTRAMUSCULAR | Status: DC | PRN
  Administered 2017-10-09: 100 mg via INTRAVENOUS

## 2017-10-09 MED ORDER — FENTANYL CITRATE (PF) 100 MCG/2ML IJ SOLN
INTRAMUSCULAR | Status: AC
  Filled 2017-10-09: qty 2

## 2017-10-09 MED ORDER — ONDANSETRON HCL 4 MG/2ML IJ SOLN
INTRAMUSCULAR | Status: DC | PRN
  Administered 2017-10-09: 4 mg via INTRAVENOUS

## 2017-10-09 MED ORDER — PROPOFOL 10 MG/ML IV BOLUS
INTRAVENOUS | Status: DC | PRN
  Administered 2017-10-09: 200 mg via INTRAVENOUS

## 2017-10-09 MED ORDER — OXYCODONE HCL 5 MG/5ML PO SOLN
5.0000 mg | Freq: Once | ORAL | Status: DC | PRN
  Filled 2017-10-09: qty 5

## 2017-10-09 MED ORDER — 0.9 % SODIUM CHLORIDE (POUR BTL) OPTIME
TOPICAL | Status: DC | PRN
  Administered 2017-10-09: 1000 mL

## 2017-10-09 MED ORDER — HYDROMORPHONE HCL 1 MG/ML IJ SOLN
0.2500 mg | INTRAMUSCULAR | Status: DC | PRN
  Administered 2017-10-09 (×2): 0.5 mg via INTRAVENOUS

## 2017-10-09 MED ORDER — MIDAZOLAM HCL 5 MG/5ML IJ SOLN
INTRAMUSCULAR | Status: DC | PRN
  Administered 2017-10-09: 2 mg via INTRAVENOUS

## 2017-10-09 MED ORDER — METHOCARBAMOL 1000 MG/10ML IJ SOLN
500.0000 mg | Freq: Four times a day (QID) | INTRAVENOUS | Status: DC | PRN
  Administered 2017-10-09: 500 mg via INTRAVENOUS
  Filled 2017-10-09: qty 550

## 2017-10-09 MED ORDER — VANCOMYCIN HCL IN DEXTROSE 1-5 GM/200ML-% IV SOLN
INTRAVENOUS | Status: AC
  Filled 2017-10-09: qty 200

## 2017-10-09 MED ORDER — GABAPENTIN 300 MG PO CAPS
300.0000 mg | ORAL_CAPSULE | Freq: Three times a day (TID) | ORAL | Status: DC
  Administered 2017-10-09: 300 mg via ORAL
  Filled 2017-10-09: qty 1

## 2017-10-09 MED ORDER — ONDANSETRON HCL 4 MG/2ML IJ SOLN
INTRAMUSCULAR | Status: AC
  Filled 2017-10-09: qty 2

## 2017-10-09 MED ORDER — HYDROMORPHONE HCL 1 MG/ML IJ SOLN
INTRAMUSCULAR | Status: AC
  Filled 2017-10-09: qty 1

## 2017-10-09 MED ORDER — PROPOFOL 10 MG/ML IV BOLUS
INTRAVENOUS | Status: AC
  Filled 2017-10-09: qty 20

## 2017-10-09 MED ORDER — METOCLOPRAMIDE HCL 5 MG/ML IJ SOLN: 5.0000 mg | Freq: Three times a day (TID) | INTRAMUSCULAR | Status: DC | PRN

## 2017-10-09 MED ORDER — VANCOMYCIN HCL 10 G IV SOLR
1500.0000 mg | Freq: Two times a day (BID) | INTRAVENOUS | Status: AC
  Administered 2017-10-10: 1500 mg via INTRAVENOUS
  Filled 2017-10-09: qty 1500

## 2017-10-09 MED ORDER — LACTATED RINGERS IV SOLN
INTRAVENOUS | Status: DC | PRN
  Administered 2017-10-09: 16:00:00 via INTRAVENOUS

## 2017-10-09 MED ORDER — OXYCODONE HCL 5 MG PO TABS
5.0000 mg | ORAL_TABLET | ORAL | Status: DC | PRN
  Administered 2017-10-09 (×2): 5 mg via ORAL
  Administered 2017-10-10 (×2): 10 mg via ORAL
  Filled 2017-10-09: qty 2
  Filled 2017-10-09: qty 1
  Filled 2017-10-09: qty 2
  Filled 2017-10-09: qty 1

## 2017-10-09 MED ORDER — ACETAMINOPHEN 500 MG PO TABS
1000.0000 mg | ORAL_TABLET | Freq: Four times a day (QID) | ORAL | Status: DC
  Administered 2017-10-09 – 2017-10-10 (×2): 1000 mg via ORAL
  Filled 2017-10-09 (×2): qty 2

## 2017-10-09 MED ORDER — LACTATED RINGERS IV SOLN: INTRAVENOUS | Status: DC

## 2017-10-09 MED ORDER — SODIUM CHLORIDE 0.9 % IV BOLUS
1000.0000 mL | Freq: Once | INTRAVENOUS | Status: AC
  Administered 2017-10-09: 1000 mL via INTRAVENOUS

## 2017-10-09 MED ORDER — ONDANSETRON HCL 4 MG PO TABS: 4.0000 mg | ORAL_TABLET | Freq: Four times a day (QID) | ORAL | Status: DC | PRN

## 2017-10-09 MED ORDER — VANCOMYCIN HCL 10 G IV SOLR
1500.0000 mg | Freq: Once | INTRAVENOUS | Status: AC
  Administered 2017-10-09: 1500 mg via INTRAVENOUS
  Filled 2017-10-09: qty 1500

## 2017-10-09 MED ORDER — FENTANYL CITRATE (PF) 100 MCG/2ML IJ SOLN
INTRAMUSCULAR | Status: DC | PRN
  Administered 2017-10-09: 100 ug via INTRAVENOUS

## 2017-10-09 SURGICAL SUPPLY — 36 items
BAG SPEC THK2 15X12 ZIP CLS (MISCELLANEOUS) ×1
BAG ZIPLOCK 12X15 (MISCELLANEOUS) ×3 IMPLANT
BANDAGE ACE 4X5 VEL STRL LF (GAUZE/BANDAGES/DRESSINGS) ×2 IMPLANT
BANDAGE ACE 6X5 VEL STRL LF (GAUZE/BANDAGES/DRESSINGS) ×2 IMPLANT
BANDAGE ESMARK 6X9 LF (GAUZE/BANDAGES/DRESSINGS) ×1 IMPLANT
BNDG CMPR 9X6 STRL LF SNTH (GAUZE/BANDAGES/DRESSINGS)
BNDG ESMARK 6X9 LF (GAUZE/BANDAGES/DRESSINGS)
BNDG GAUZE ELAST 4 BULKY (GAUZE/BANDAGES/DRESSINGS) ×3 IMPLANT
COVER SURGICAL LIGHT HANDLE (MISCELLANEOUS) ×3 IMPLANT
CUFF TOURN SGL QUICK 18 (TOURNIQUET CUFF) IMPLANT
CUFF TOURN SGL QUICK 24 (TOURNIQUET CUFF)
CUFF TOURN SGL QUICK 34 (TOURNIQUET CUFF)
CUFF TRNQT CYL 24X4X40X1 (TOURNIQUET CUFF) IMPLANT
CUFF TRNQT CYL 34X4X40X1 (TOURNIQUET CUFF) IMPLANT
DRAIN PENROSE 18X1/2 LTX STRL (DRAIN) ×1 IMPLANT
DRSG PAD ABDOMINAL 8X10 ST (GAUZE/BANDAGES/DRESSINGS) ×6 IMPLANT
DURAPREP 26ML APPLICATOR (WOUND CARE) ×1 IMPLANT
ELECT REM PT RETURN 15FT ADLT (MISCELLANEOUS) ×3 IMPLANT
GAUZE PACKING IODOFORM 1/2 (PACKING) ×2 IMPLANT
GAUZE SPONGE 4X4 12PLY STRL (GAUZE/BANDAGES/DRESSINGS) ×3 IMPLANT
GLOVE SURG ORTHO 8.0 STRL STRW (GLOVE) ×3 IMPLANT
GOWN STRL REUS W/TWL LRG LVL3 (GOWN DISPOSABLE) ×3 IMPLANT
HANDPIECE INTERPULSE COAX TIP (DISPOSABLE) ×3
KIT BASIN OR (CUSTOM PROCEDURE TRAY) ×3 IMPLANT
PACK TOTAL JOINT (CUSTOM PROCEDURE TRAY) ×3 IMPLANT
PAD ABD 8X10 STRL (GAUZE/BANDAGES/DRESSINGS) ×4 IMPLANT
PAD CAST 4YDX4 CTTN HI CHSV (CAST SUPPLIES) ×1 IMPLANT
PADDING CAST COTTON 4X4 STRL (CAST SUPPLIES) ×3
PADDING CAST COTTON 6X4 STRL (CAST SUPPLIES) ×2 IMPLANT
POSITIONER SURGICAL ARM (MISCELLANEOUS) ×3 IMPLANT
SCRUB SOL TECHNICARE 32FOOTPED (MISCELLANEOUS) ×2 IMPLANT
SET HNDPC FAN SPRY TIP SCT (DISPOSABLE) ×1 IMPLANT
SWAB COLLECTION DEVICE MRSA (MISCELLANEOUS) ×2 IMPLANT
SYR CONTROL 10ML LL (SYRINGE) ×3 IMPLANT
TOWEL OR 17X26 10 PK STRL BLUE (TOWEL DISPOSABLE) ×4 IMPLANT
TUBE ANAEROBIC SPECIMEN COL (MISCELLANEOUS) ×2 IMPLANT
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
## 2017-10-09 NOTE — Anesthesia Preprocedure Evaluation (Addendum)
Anesthesia Evaluation  Patient identified by MRN, date of birth, ID band Patient awake    Reviewed: Allergy & Precautions, NPO status , Patient's Chart, lab work & pertinent test results  Airway Mallampati: II  TM Distance: >3 FB Neck ROM: Full    Dental no notable dental hx.    Pulmonary neg pulmonary ROS, Current Smoker,    Pulmonary exam normal breath sounds clear to auscultation       Cardiovascular hypertension, negative cardio ROS Normal cardiovascular exam Rhythm:Regular Rate:Normal     Neuro/Psych negative neurological ROS  negative psych ROS   GI/Hepatic negative GI ROS, Neg liver ROS,   Endo/Other  negative endocrine ROS  Renal/GU negative Renal ROS  negative genitourinary   Musculoskeletal negative musculoskeletal ROS (+)   Abdominal (+) + obese,   Peds negative pediatric ROS (+)  Hematology negative hematology ROS (+)   Anesthesia Other Findings   Reproductive/Obstetrics negative OB ROS                             Anesthesia Physical Anesthesia Plan  ASA: II and emergent  Anesthesia Plan: General   Post-op Pain Management:    Induction: Intravenous  PONV Risk Score and Plan: 1 and Ondansetron  Airway Management Planned: LMA  Additional Equipment:   Intra-op Plan:   Post-operative Plan: Extubation in OR  Informed Consent: I have reviewed the patients History and Physical, chart, labs and discussed the procedure including the risks, benefits and alternatives for the proposed anesthesia with the patient or authorized representative who has indicated his/her understanding and acceptance.   Dental advisory given  Plan Discussed with: CRNA  Anesthesia Plan Comments:        Anesthesia Quick Evaluation

## 2017-10-09 NOTE — Transfer of Care (Signed)
Immediate Anesthesia Transfer of Care Note  [facility]  Procedure(s) Performed: IRRIGATION AND DEBRIDEMENT KNEE (Left Knee)  Patient Location: PACU  Anesthesia Type:General  Level of Consciousness: awake, alert  and oriented  Airway & Oxygen Therapy: Patient Spontanous Breathing and Patient connected to face mask oxygen  Post-op Assessment: Report given to RN and Post -op Vital signs reviewed and stable  Post vital signs: Reviewed and stable  Last Vitals:  Vitals Value Taken Time  BP    Temp    Pulse 93 10/09/2017  5:18 PM  Resp 18 10/09/2017  5:18 PM  SpO2 100 % 10/09/2017  5:18 PM  Vitals shown include unvalidated device data.  Last Pain:  Vitals:   10/09/17 1349  TempSrc:   PainSc: 10-Worst pain ever         Complications: No apparent anesthesia complications

## 2017-10-09 NOTE — ED Provider Notes (Signed)
[facility]-EMERGENCY DEPT Provider Note   CSN: [id number] Arrival date & time: 10/09/17  1014     History   Chief Complaint Chief Complaint  Patient presents with  . Cellulitis    HPI Daniel Hanson is a 52 y.o. male.  The history is provided by the patient, medical records and the spouse. No language interpreter was used.  Knee Pain   This is a new problem. The current episode started yesterday. The problem occurs constantly. The problem has been gradually worsening. The pain is present in the left knee. The quality of the pain is described as aching. The pain is at a severity of 9/10. The pain is severe. Associated symptoms include limited range of motion. Pertinent negatives include no numbness, no stiffness, no tingling and no itching. The symptoms are aggravated by contact, standing and activity. He has tried nothing for the symptoms. The treatment provided no relief. There has been no history of extremity trauma.    Past Medical History:  Diagnosis Date  . Neck fracture [facility] 1986    Patient Active Problem List   Diagnosis Date Noted  . Obesity 05/06/2015  . Essential hypertension 05/06/2015  . Lumbago 04/26/2015  . Fibula fracture 04/25/2015    History reviewed. No pertinent surgical history.      Home Medications    Prior to Admission medications   Medication Sig Start Date End Date Taking? Authorizing Provider  ibuprofen (ADVIL,MOTRIN) 800 MG tablet Take 1 tablet (800 mg total) by mouth every 8 (eight) hours as needed. 08/06/17   Park Liter, DPM    Family History Family History  Problem Relation Age of Onset  . Stroke Father   . Parkinson's disease Father     Social History Social History   Tobacco Use  . Smoking status: Current Every Day Smoker    Packs/day: 1.00    Types: Cigarettes  . Smokeless tobacco: Never Used  Substance Use Topics  . Alcohol use: Yes    Comment: "a 12 pack a week"  . Drug use: No      Allergies   Patient has no known allergies.   Review of Systems Review of Systems  Constitutional: Negative for chills, diaphoresis, fatigue and fever.  HENT: Negative for congestion.   Eyes: Negative for visual disturbance.  Respiratory: Negative for cough, chest tightness, shortness of breath and stridor.   Gastrointestinal: Negative for abdominal pain.  Genitourinary: Negative for dysuria and flank pain.  Musculoskeletal: Positive for joint swelling. Negative for back pain, neck pain, neck stiffness and stiffness.  Skin: Positive for rash. Negative for itching and wound.  Neurological: Negative for tingling, light-headedness, numbness and headaches.  Psychiatric/Behavioral: Negative for agitation.  All other systems reviewed and are negative.    Physical Exam Updated Vital Signs BP 132/89 (BP Location: Left Arm)   Pulse 93   Temp 98.1 F (36.7 C) (Oral)   Resp 19   Ht 5' 10.5" (1.791 m)   Wt 118.4 kg (261 lb)   SpO2 98%   BMI 36.92 kg/m   Physical Exam  Constitutional: He is oriented to person, place, and time. He appears well-developed and well-nourished. No distress.  HENT:  Head: Normocephalic and atraumatic.  Eyes: Conjunctivae are normal.  Neck: Neck supple.  Cardiovascular: Normal rate and regular rhythm.  No murmur heard. Pulmonary/Chest: Effort normal and breath sounds normal. No respiratory distress.  Abdominal: Soft. There is no tenderness.  Musculoskeletal: He exhibits edema and tenderness.  Left knee: He exhibits decreased range of motion, swelling and erythema. He exhibits no laceration. Tenderness found.  Neurological: He is alert and oriented to person, place, and time. No sensory deficit.  Skin: Skin is warm and dry. Capillary refill takes less than 2 seconds. He is not diaphoretic. There is erythema. No pallor.  Psychiatric: He has a normal mood and affect.  Nursing note and vitals reviewed.       ED Treatments / Results   Labs (all labs ordered are listed, but only abnormal results are displayed) Labs Reviewed  COMPREHENSIVE METABOLIC PANEL - Abnormal; Notable for the following components:      Result Value   Glucose, Bld 188 (*)    Calcium 8.7 (*)    All other components within normal limits  CBC WITH DIFFERENTIAL/PLATELET - Abnormal; Notable for the following components:   WBC 13.0 (*)    Neutro Abs 10.8 (*)    All other components within normal limits  I-STAT CG4 LACTIC ACID, ED - Abnormal; Notable for the following components:   Lactic Acid, Venous 2.90 (*)    All other components within normal limits  CULTURE, BLOOD (ROUTINE X 2)  CULTURE, BLOOD (ROUTINE X 2)  GRAM STAIN  AEROBIC/ANAEROBIC CULTURE (SURGICAL/DEEP WOUND)    EKG None  Radiology Dg Knee Complete 4 Views Left  Result Date: 10/09/2017 CLINICAL DATA:  Left knee pain.  Redness and swelling for 3 weeks. EXAM: LEFT KNEE - COMPLETE 4+ VIEW COMPARISON:  None. FINDINGS: Trace suprapatellar joint effusion. No fracture or dislocation identified. No radio-opaque foreign bodies. IMPRESSION: 1. No acute bone abnormality. 2. Trace suprapatellar joint effusion. Electronically Signed   By: Signa Kellaylor  Stroud M.D.   On: 10/09/2017 14:39      Procedures Procedures (including critical care time)  Medications Ordered in ED Medications  sodium chloride 0.9 % bolus 1,000 mL (0 mLs Intravenous Stopped 10/09/17 1548)  vancomycin (VANCOCIN) 1,500 mg in sodium chloride 0.9 % 500 mL IVPB (1,500 mg Intravenous New Bag/Given 10/09/17 1700)     Initial Impression / Assessment and Plan / ED Course  I have reviewed the triage vital signs and the nursing notes.  Pertinent labs & imaging results that were available during my care of the patient were reviewed by me and considered in my medical decision making (see chart for details).     Daniel Hanson is a 52 y.o. male with a past medical history significant for hypertension, obesity,  prior  cellulitis and prior elbow infection who presents with left knee pain, redness, and swelling.  Patient reports that he has had a history of right lower leg cellulitis from an injury of his foot.  He reports that last month he had an infection of his right elbow that required multiple antibiotics.  He reports that he has not had symptoms with these locations but yesterday began developing pain redness and swelling of the left knee.  He reports it is very swollen and warm.  He is concerned it is infected.  He denies systemic signs of infection with no fever, chills, nausea, vomiting, or urinary symptoms.  He reports the pain is severe when he tries to ambulate or bend his knee.  He is never had infection in this leg before.  He denies any numbness, Tingley, or weakness of the extremity.  He denies any redness in other locations.    On exam, patient has a warm tender right knee.  There is tenderness to palpation and with manipulation of  the knee.  Patient has pain with both passive and active movement of the knee joint.  Patient normal pulses and sensation and strength distally.  Patient had no abdominal tenderness and lungs were clear.    Patient screen laboratory testing showing evidence of leukocyte ptosis and elevated lactic acid.  X-ray was obtained showing no bony abnormality but there was a swelling above the joint.   To the overlying erythema, I was hesitant to do any aspiration to rule out septic joint given the leukocyte dose and lactic acid.  Orthopedics was called and will come to the patient.  Next para orthopedic came and will take the patient to the operating room to washout.  They will obtain cultures prior to antibiotics.  Patient taken to the OR for further management.  Anticipate admission for further management of the patient with the cellulitis versus knee infection.   Final Clinical Impressions(s) / ED Diagnoses   Final diagnoses:  Acute pain of left knee     Clinical  Impression: 1. Acute pain of left knee     Disposition: Admit  This note was prepared with assistance of Dragon voice recognition software. Occasional wrong-word or sound-a-like substitutions may have occurred due to the inherent limitations of voice recognition software.      Mckinlee Dunk, Canary Brim, MD 10/09/17 [phone]

## 2017-10-09 NOTE — H&P (Signed)
Daniel Hanson is an 52 y.o. male.   Chief Complaint: Left knee pain HPI: Daniel Hanson is a 52 year old patient with 1 day history of atraumatic onset left knee pain.  He developed some redness this morning.  He has a history of multiple superficial infections involving the right leg and right elbow.  He was last on oral antibiotics March 4.  He works as a Dealer.  He denies any traumatic injury to the left knee.  He denies any fevers or chills.  He is not diabetic.  He is here with his wife.  Past Medical History:  Diagnosis Date  . Neck fracture ([place]) 1986    History reviewed. No pertinent surgical history.  Family History  Problem Relation Age of Onset  . Stroke Father   . Parkinson's disease Father    Social History:  reports that he has been smoking cigarettes.  He has been smoking about 1.00 pack per day. He has never used smokeless tobacco. He reports that he drinks alcohol. He reports that he does not use drugs.  Allergies: No Known Allergies   (Not in a hospital admission)  Results for orders placed or performed during the hospital encounter of 10/09/17 (from the past 48 hour(s))  Comprehensive metabolic panel     Status: Abnormal   Collection Time: 10/09/17 12:24 PM  Result Value Ref Range   Sodium 135 135 - 145 mmol/L   Potassium 3.8 3.5 - 5.1 mmol/L   Chloride 103 101 - 111 mmol/L   CO2 22 22 - 32 mmol/L   Glucose, Bld 188 (H) 65 - 99 mg/dL   BUN 15 6 - 20 mg/dL   Creatinine, Ser 0.74 0.61 - 1.24 mg/dL   Calcium 8.7 (L) 8.9 - 10.3 mg/dL   Total Protein 7.1 6.5 - 8.1 g/dL   Albumin 4.3 3.5 - 5.0 g/dL   AST 22 15 - 41 U/L   ALT 26 17 - 63 U/L   Alkaline Phosphatase 84 38 - 126 U/L   Total Bilirubin 0.7 0.3 - 1.2 mg/dL   GFR calc non Af Amer >60 >60 mL/min   GFR calc Af Amer >60 >60 mL/min    Comment: (NOTE) The eGFR has been calculated using the CKD EPI equation. This calculation has not been validated in all clinical situations. eGFR's persistently <60  mL/min signify possible Chronic Kidney Disease.    Anion gap 10 5 - 15    Comment: Performed at [facility], [place] [address], [place], [place] [address]  CBC with Differential     Status: Abnormal   Collection Time: 10/09/17 12:24 PM  Result Value Ref Range   WBC 13.0 (H) 4.0 - 10.5 K/uL   RBC 4.55 4.22 - 5.81 MIL/uL   Hemoglobin 14.8 13.0 - 17.0 g/dL   HCT 43.4 39.0 - 52.0 %   MCV 95.4 78.0 - 100.0 fL   MCH 32.5 26.0 - 34.0 pg   MCHC 34.1 30.0 - 36.0 g/dL   RDW 13.4 11.5 - 15.5 %   Platelets 175 150 - 400 K/uL   Neutrophils Relative % 83 %   Neutro Abs 10.8 (H) 1.7 - 7.7 K/uL   Lymphocytes Relative 12 %   Lymphs Abs 1.6 0.7 - 4.0 K/uL   Monocytes Relative 4 %   Monocytes Absolute 0.5 0.1 - 1.0 K/uL   Eosinophils Relative 1 %   Eosinophils Absolute 0.2 0.0 - 0.7 K/uL   Basophils Relative 0 %   Basophils Absolute  0.0 0.0 - 0.1 K/uL    Comment: Performed at [facility], [place] [address], [place], [place] [address]  I-Stat CG4 Lactic Acid, ED     Status: Abnormal   Collection Time: 10/09/17 12:31 PM  Result Value Ref Range   Lactic Acid, Venous 2.90 (HH) 0.5 - 1.9 mmol/L   Comment NOTIFIED PHYSICIAN    Dg Knee Complete 4 Views Left  Result Date: 10/09/2017 CLINICAL DATA:  Left knee pain.  Redness and swelling for 3 weeks. EXAM: LEFT KNEE - COMPLETE 4+ VIEW COMPARISON:  None. FINDINGS: Trace suprapatellar joint effusion. No fracture or dislocation identified. No radio-opaque foreign bodies. IMPRESSION: 1. No acute bone abnormality. 2. Trace suprapatellar joint effusion. Electronically Signed   By: Kerby Moors M.D.   On: 10/09/2017 14:39    Review of Systems  Musculoskeletal: Positive for joint pain.  All other systems reviewed and are negative.   Blood pressure (!) 132/97, pulse 84, temperature 98.1 F (36.7 C), temperature source Oral, resp. rate 18, height 5' 10.5" (1.791 m), weight 261 lb (118.4 kg), SpO2 97 %. Physical Exam   Constitutional: He appears well-developed.  HENT:  Head: Normocephalic.  Eyes: Pupils are equal, round, and reactive to light.  Neck: Normal range of motion.  Cardiovascular: Normal rate.  Respiratory: Effort normal.  Neurological: He is alert.  Skin: Skin is warm.  Psychiatric: He has a normal mood and affect.  Left knee examination demonstrates an area of cellulitis superior to the patellar tendon measuring about 5 x 5 cm.  There is a small fluid collection beneath the skin in the prepatellar bursa.  I do not detect a definitive knee joint effusion.  The patient does have intact extensor mechanism and can flex his knee to 90 degrees with only mild discomfort.  Pedal pulses palpable bilaterally.  No tissue crepitus present.  Impression is left knee infectious prepatellar bursitis.  Assessment/Plan This is early in its development.  With the patient's history I think his best course would be operative I&D with antibiotics held until we obtain culture.  Start him on IV vancomycin at that time with 1 postop dose of IV vancomycin.  Culture results may not be available for 2 days.  Would likely send him home on oral doxycycline.  The risk and benefits are discussed.  All questions answered.  Anderson Malta, MD 10/09/2017, 3:31 PM

## 2017-10-09 NOTE — Anesthesia Procedure Notes (Signed)
Procedure Name: LMA Insertion Date/Time: 10/09/2017 4:38 PM Performed by: Chrisie Jankovich D, CRNA Pre-anesthesia Checklist: Patient identified, Emergency Drugs available, Suction available and Patient being monitored Patient Re-evaluated:Patient Re-evaluated prior to induction Oxygen Delivery Method: Circle system utilized Preoxygenation: Pre-oxygenation with 100% oxygen Induction Type: IV induction Ventilation: Mask ventilation without difficulty LMA: LMA inserted LMA Size: 5.0 Tube type: Oral Number of attempts: 1 Placement Confirmation: positive ETCO2 and breath sounds checked- equal and bilateral Tube secured with: Tape Dental Injury: Teeth and Oropharynx as per pre-operative assessment

## 2017-10-09 NOTE — Brief Op Note (Signed)
10/09/2017  5:16 PM  PATIENT:  Dorna BloomKevin W Pensinger  52 y.o. male  PRE-OPERATIVE DIAGNOSIS:  left knee pre-patella bursitis  POST-OPERATIVE DIAGNOSIS:  left knee pre-patella bursitis  PROCEDURE:  Procedure(s): IRRIGATION AND DEBRIDEMENT KNEE  SURGEON:  Surgeon(s): Cammy Copaean, Yahsir Wickens Scott, MD  ASSISTANT: None  ANESTHESIA:   general  EBL: 25 ml    Total I/O In: 1000 [IV Piggyback:1000] Out: -   BLOOD ADMINISTERED: none  DRAINS: Iodosorb x1  LOCAL MEDICATIONS USED:  none  SPECIMEN: Prepatellar bursa fluid infected and sent for culture  COUNTS:  YES  TOURNIQUET:  * No tourniquets in log *  DICTATION: .Other Dictation: Dictation Number [id number]  PLAN OF CARE: Admit for overnight observation  PATIENT DISPOSITION:  PACU - hemodynamically stable

## 2017-10-09 NOTE — ED Triage Notes (Signed)
Pt reports he is [employer]at[employer] and since yesterday been having left knee pain. Patient has erythema and heat on left knee.

## 2017-10-10 ENCOUNTER — Encounter (HOSPITAL_COMMUNITY): Payer: Self-pay | Admitting: Orthopedic Surgery

## 2017-10-10 NOTE — Progress Notes (Signed)
Subjective: Patient stable.  Drain removed.  Wife has prescription for doxycycline and pain medicine.   Objective: Vital signs in last 24 hours: Temp:  [97.9 F (36.6 C)-100 F (37.8 C)] 97.9 F (36.6 C) (03/31 0620) Pulse Rate:  [73-104] 104 (03/31 0620) Resp:  [15-23] 19 (03/31 0620) BP: (132-170)/(84-107) 136/84 (03/31 0620) SpO2:  [91 %-100 %] 100 % (03/31 0620) Weight:  [261 lb (118.4 kg)] 261 lb (118.4 kg) (03/30 1022)  Intake/Output from previous day: 03/30 0701 - 03/31 0700 In: 1560 [P.O.:60; I.V.:500; IV Piggyback:1000] Out: -  Intake/Output this shift: No intake/output data recorded.  Exam:  Dorsiflexion/Plantar flexion intact  Labs: Recent Labs    10/09/17 1224  HGB 14.8   Recent Labs    10/09/17 1224  WBC 13.0*  RBC 4.55  HCT 43.4  PLT 175   Recent Labs    10/09/17 1224  NA 135  K 3.8  CL 103  CO2 22  BUN 15  CREATININE 0.74  GLUCOSE 188*  CALCIUM 8.7*   No results for input(s): LABPT, INR in the last 72 hours.  Assessment/Plan: Plan at this time is to discharge to home today.  Okay to weight-bear as tolerated.  Okay for range of motion of the knee.  Leave current dressing in place.  Follow-up with me on Wednesday just to recheck the incision.  I would prefer that he stay out of work until that time.   Marrianne MoodG Scott Teigan Manner 10/10/2017, 8:57 AM

## 2017-10-10 NOTE — Progress Notes (Signed)
Nurse reviewed discharge instructions with pt.  Pt verbalized understanding of discharge instructions, follow up appointments and new medications.  Prescriptions given to pt girlfriend by MD. Aquacel dressing given to pt per MD order for follow up in office.

## 2017-10-11 ENCOUNTER — Telehealth (INDEPENDENT_AMBULATORY_CARE_PROVIDER_SITE_OTHER): Payer: Self-pay | Admitting: Orthopedic Surgery

## 2017-10-11 ENCOUNTER — Encounter (INDEPENDENT_AMBULATORY_CARE_PROVIDER_SITE_OTHER): Payer: Self-pay | Admitting: Radiology

## 2017-10-11 NOTE — Progress Notes (Signed)
thx

## 2017-10-11 NOTE — Telephone Encounter (Signed)
error 

## 2017-10-11 NOTE — Op Note (Signed)
NAME:  Daniel Hanson, Daniel Hanson         ACCOUNT NO.:  [id number]  MEDICAL RECORD NO.:  [id number]  LOCATION:                                 FACILITY:  PHYSICIAN:  Burnard BuntingG. Scott Medford Staheli, M.D.         DATE OF BIRTH:  DATE OF PROCEDURE: DATE OF DISCHARGE:                              OPERATIVE REPORT   PREOPERATIVE DIAGNOSIS:  Left knee infected prepatellar bursitis.  POSTOPERATIVE DIAGNOSIS:  Left knee infected prepatellar bursitis.  PROCEDURE:  Left knee prepatellar bursa I and D with excisional debridement of the bursa.  SURGEON:  Burnard BuntingG. Scott Seba Madole, M.D.  ASSISTANT:  None.  INDICATIONS:  Daniel Hanson is a 52 year old patient with infected prepatellar bursitis.  He presents now for operative management after explanation of risks and benefits.  PROCEDURE IN DETAIL:  Daniel Hanson was brought to the operating room where general anesthetic was induced.  Preoperative IV antibiotics were held until culture was obtained and then vancomycin was given.  Left leg was prescrubbed with Hibiclens, draped in a sterile manner.  Time-out was called.  Incision was made over the prepatellar bursa.  Purulent fluid encountered and sent for culture.  Antibiotics were given.  Excisional debridement of the prepatellar bursa was performed both sharply with a knife as well as with a curette.  No bone was involved.  Only the prepatellar bursa was involved.  Following prepatellar bursa excision, this area was irrigated with 4 L of irrigating solution.  Iodosorb was then packed into the prepatellar bursa site and the incision was loosely closed using 2-0 nylon suture.  Bulky dressing placed.  The patient tolerated the procedure well without immediate complication. Transferred to the recovery room.  PLAN:  One more dose of IV antibiotics postop, then we will pull the drain tomorrow.  I will see him back in clinic in 7 days.  Anticipate home on oral pain medicine as well as doxycycline.     Burnard BuntingG. Scott Jerre Diguglielmo,  M.D.   ______________________________ Reece AgarG. Dorene GrebeScott Shanieka Blea, M.D.   GSD/MEDQ  D:  10/09/2017  T:  10/09/2017  Job:  [id number]

## 2017-10-11 NOTE — Progress Notes (Unsigned)
Patient came into the office today and stated that the dressing has come undone and he needs it rewrapped.  He did not bring dressing supplies in that he was given postop.  Dressing was loose, I removed it, there had been fresh bleeding/drainage.  No bleeding or draining at that time however.  I rewrapped the dressing, down his leg still.  I advised him to rest and elevate above heart level to help with swelling.  Appt made for Wednesday 845 am for him to see Dr August Saucerean postop for dressing change, and he will bring his supplied dressing he was given at postop.

## 2017-10-12 ENCOUNTER — Other Ambulatory Visit (INDEPENDENT_AMBULATORY_CARE_PROVIDER_SITE_OTHER): Payer: Self-pay

## 2017-10-12 MED ORDER — SULFAMETHOXAZOLE-TRIMETHOPRIM 800-160 MG PO TABS: 1.0000 | ORAL_TABLET | Freq: Two times a day (BID) | ORAL | 0 refills | Status: DC

## 2017-10-12 NOTE — Progress Notes (Signed)
Can u have him stop doxy and take septra ds 1 po bid for 2 weeks thx

## 2017-10-13 ENCOUNTER — Ambulatory Visit (INDEPENDENT_AMBULATORY_CARE_PROVIDER_SITE_OTHER): Payer: BLUE CROSS/BLUE SHIELD | Admitting: Orthopedic Surgery

## 2017-10-13 DIAGNOSIS — M25562 Pain in left knee: Secondary | ICD-10-CM

## 2017-10-13 MED ORDER — SULFAMETHOXAZOLE-TRIMETHOPRIM 800-160 MG PO TABS: 1.0000 | ORAL_TABLET | Freq: Two times a day (BID) | ORAL | 0 refills | Status: DC

## 2017-10-13 MED ORDER — OXYCODONE HCL 5 MG PO CAPS: ORAL_CAPSULE | ORAL | 0 refills | Status: DC

## 2017-10-14 LAB — CULTURE, BLOOD (ROUTINE X 2)
Culture: NO GROWTH
Culture: NO GROWTH
Special Requests: ADEQUATE
Special Requests: ADEQUATE

## 2017-10-14 NOTE — Discharge Summary (Signed)
Physician Discharge Summary  Patient ID: Daniel Hanson MRN: [id number] DOB/AGE: [id number] y.o.  Admit date: 10/09/2017 Discharge date: 10/10/2017  Admission Diagnoses:  Active Problems:   Septic prepatellar bursitis of left knee   Discharge Diagnoses:  Same  Surgeries: Procedure(s): IRRIGATION AND DEBRIDEMENT KNEE on 10/09/2017   Consultants:   Discharged Condition: Stable  Hospital Course: Daniel Hanson is an 52 y.o. male who was admitted 10/09/2017 with a chief complaint of  Chief Complaint  Patient presents with  . Cellulitis  , and found to have a diagnosis of septic prepatellar bursitis left knee.  They were brought to the operating room on 10/09/2017 and underwent the above named procedures.  Patient tolerated the procedure well.  Drain removed from the prepatellar bursa on postop day #1.  Patient received a dose of postop vancomycin following surgery.  He is discharged home in good condition on oral antibiotics.  They can be changed pending final determination of bacterial agents of infection.  I will see him back in 3 days to discuss return to work.  Antibiotics given:  Anti-infectives (From admission, onward)   Start     Dose/Rate Route Frequency Ordered Stop   10/10/17 0600  vancomycin (VANCOCIN) 1,500 mg in sodium chloride 0.9 % 500 mL IVPB     1,500 mg 250 mL/hr over 120 Minutes Intravenous Every 12 hours 10/09/17 1834 10/10/17 0920   10/09/17 1645  vancomycin (VANCOCIN) 1,500 mg in sodium chloride 0.9 % 500 mL IVPB     1,500 mg 250 mL/hr over 120 Minutes Intravenous  Once 10/09/17 1633 10/09/17 1700   10/09/17 1625  vancomycin (VANCOCIN) 1-5 GM/200ML-% IVPB  Status:  Discontinued    Note to Pharmacy:  Pozil, Traci   : cabinet override      10/09/17 1625 10/09/17 1635    .  Recent vital signs:  Vitals:   10/10/17 0600 10/10/17 0620  BP:  136/84  Pulse:  (!) 104  Resp:  19  Temp:  97.9 F (36.6 C)  SpO2: 100% 100%    Recent laboratory  studies:  Results for orders placed or performed during the hospital encounter of 10/09/17  Blood culture (routine x 2)  Result Value Ref Range   Specimen Description      BLOOD RIGHT ANTECUBITAL Performed at [facility], [address] [place], [place] [address]    Special Requests      BOTTLES DRAWN AEROBIC AND ANAEROBIC Blood Culture adequate volume Performed at [facility], [address] [place], [place] [address]    Culture      NO GROWTH 5 DAYS Performed at [facility], [address], [place], [place] [address]    Report Status 10/14/2017 FINAL   Blood culture (routine x 2)  Result Value Ref Range   Specimen Description      BLOOD LEFT ANTECUBITAL Performed at [facility], [address] [place], [place] [address]    Special Requests      BOTTLES DRAWN AEROBIC AND ANAEROBIC Blood Culture adequate volume Performed at [facility], [address] [place], [place] [address]    Culture      NO GROWTH 5 DAYS Performed at [facility], [address] [place], [place] [address]    Report Status 10/14/2017 FINAL   Aerobic/Anaerobic Culture (surgical/deep wound)  Result Value Ref Range   Specimen Description      ABSCESS Performed at [facility], [address] [place], [place] [address]    Special Requests      NONE Performed at [facility], [address] [place], [place] [address]    Gram Stain      MODERATE WBC PRESENT, PREDOMINANTLY PMN FEW GRAM POSITIVE COCCI IN CLUSTERS Performed at [facility], [address] [place], [place] [address]    Culture      MODERATE METHICILLIN RESISTANT STAPHYLOCOCCUS AUREUS NO ANAEROBES ISOLATED; CULTURE IN PROGRESS FOR 5 DAYS    Report Status PENDING    Organism ID, Bacteria METHICILLIN RESISTANT STAPHYLOCOCCUS AUREUS       Susceptibility   Methicillin resistant staphylococcus  aureus - MIC*    CIPROFLOXACIN >=8 RESISTANT Resistant     ERYTHROMYCIN >=8 RESISTANT Resistant     GENTAMICIN <=0.5 SENSITIVE Sensitive     OXACILLIN >=4 RESISTANT Resistant     TETRACYCLINE <=1 SENSITIVE Sensitive     VANCOMYCIN 1 SENSITIVE Sensitive     TRIMETH/SULFA <=10 SENSITIVE Sensitive     CLINDAMYCIN <=0.25 SENSITIVE Sensitive     RIFAMPIN <=0.5 SENSITIVE Sensitive     Inducible Clindamycin NEGATIVE Sensitive     * MODERATE METHICILLIN RESISTANT STAPHYLOCOCCUS AUREUS  Comprehensive metabolic panel  Result Value Ref Range   Sodium 135 135 - 145 mmol/L   Potassium 3.8 3.5 - 5.1 mmol/L   Chloride 103 101 - 111 mmol/L   CO2 22 22 - 32 mmol/L   Glucose, Bld 188 (H) 65 - 99 mg/dL   BUN 15 6 - 20 mg/dL   Creatinine, Ser 5.620.74 0.61 - 1.24 mg/dL   Calcium 8.7 (L) 8.9 - 10.3 mg/dL   Total Protein 7.1 6.5 - 8.1 g/dL   Albumin 4.3 3.5 - 5.0 g/dL   AST 22 15 - 41 U/L   ALT 26 17 - 63 U/L   Alkaline Phosphatase 84 38 - 126 U/L   Total Bilirubin 0.7 0.3 - 1.2 mg/dL   GFR calc non Af Amer >60 >60 mL/min   GFR calc Af Amer >60 >60 mL/min   Anion gap 10 5 - 15  CBC with Differential  Result Value Ref Range   WBC 13.0 (H) 4.0 - 10.5 K/uL   RBC 4.55 4.22 - 5.81 MIL/uL   Hemoglobin 14.8 13.0 - 17.0 g/dL   HCT 13.043.4 86.539.0 - 78.452.0 %   MCV 95.4 78.0 - 100.0 fL   MCH 32.5 26.0 - 34.0 pg   MCHC 34.1 30.0 - 36.0 g/dL   RDW 69.613.4 29.511.5 - 28.415.5 %   Platelets 175 150 - 400 K/uL   Neutrophils Relative % 83 %   Neutro Abs 10.8 (H) 1.7 - 7.7 K/uL   Lymphocytes Relative 12 %   Lymphs Abs 1.6 0.7 - 4.0 K/uL   Monocytes Relative 4 %   Monocytes Absolute 0.5 0.1 - 1.0 K/uL   Eosinophils Relative 1 %   Eosinophils Absolute 0.2 0.0 - 0.7 K/uL   Basophils Relative 0 %   Basophils Absolute 0.0 0.0 - 0.1 K/uL  I-Stat CG4 Lactic Acid, ED  Result Value Ref Range   Lactic Acid, Venous 2.90 (HH) 0.5 - 1.9 mmol/L   Comment NOTIFIED PHYSICIAN     Discharge Medications:   Allergies as of 10/10/2017   No  Known Allergies     Medication List    STOP taking these medications   naproxen sodium 220 MG tablet Commonly known as:  ALEVE     TAKE  these medications   ibuprofen 800 MG tablet Commonly known as:  ADVIL,MOTRIN Take 1 tablet (800 mg total) by mouth every 8 (eight) hours as needed.       Diagnostic Studies: Dg Knee Complete 4 Views Left  Result Date: 10/09/2017 CLINICAL DATA:  Left knee pain.  Redness and swelling for 3 weeks. EXAM: LEFT KNEE - COMPLETE 4+ VIEW COMPARISON:  None. FINDINGS: Trace suprapatellar joint effusion. No fracture or dislocation identified. No radio-opaque foreign bodies. IMPRESSION: 1. No acute bone abnormality. 2. Trace suprapatellar joint effusion. Electronically Signed   By: Signa Kell M.D.   On: 10/09/2017 14:39    Disposition:   Discharge Instructions    Call MD / Call 911   Complete by:  As directed    If you experience chest pain or shortness of breath, CALL 911 and be transported to the hospital emergency room.  If you develope a fever above 101 F, pus (white drainage) or increased drainage or redness at the wound, or calf pain, call your surgeon's office.   Constipation Prevention   Complete by:  As directed    Drink plenty of fluids.  Prune juice may be helpful.  You may use a stool softener, such as Colace (over the counter) 100 mg twice a day.  Use MiraLax (over the counter) for constipation as needed.   Diet - low sodium heart healthy   Complete by:  As directed    Discharge instructions   Complete by:  As directed    Weightbearing as tolerated left leg Okay to bend knee Keep current dressing in place Return to clinic on Wednesday for recheck   Increase activity slowly as tolerated   Complete by:  As directed          Signed: Burnard Bunting 10/14/2017, 6:55 PM

## 2017-10-15 ENCOUNTER — Encounter (INDEPENDENT_AMBULATORY_CARE_PROVIDER_SITE_OTHER): Payer: Self-pay | Admitting: Orthopedic Surgery

## 2017-10-15 LAB — AEROBIC/ANAEROBIC CULTURE W GRAM STAIN (SURGICAL/DEEP WOUND)

## 2017-10-15 LAB — AEROBIC/ANAEROBIC CULTURE (SURGICAL/DEEP WOUND)

## 2017-10-15 NOTE — Progress Notes (Signed)
   Post-Op Visit Note   Patient: Daniel Hanson           Date of Birth: [birth year]           MRN: [id number] Visit Date: 10/13/2017 PCP: Patient, No Pcp Per   Assessment & Plan:  Chief Complaint:  Chief Complaint  Patient presents with  . Left Knee - Pain, Routine Post Op   Visit Diagnoses:  1. Acute pain of left knee     Plan: Daniel Hanson is a patient who is now several days out from MRSA prepatellar bursitis I&D.  He has been doing reasonably well.  He was changed from doxycycline to Septra once the diagnosis of MRSA was made.  He did get some vancomycin IV in the hospital.  Oxycodone is refilled.  There is some slight fluid reaccumulation within the prepatellar bursa but no significant tenderness is present.  He is bending the knee well.  Plan is to return in 1 week for suture removal.  Potentially may need to do limited I&D at that time if any evidence of recurrent infection is present.  We may have to start packing at that time as well.  We will see what it looks like in 1 week.  Follow-Up Instructions: Return in about 1 week (around 10/20/2017).   Orders:  No orders of the defined types were placed in this encounter.  Meds ordered this encounter  Medications  . DISCONTD: sulfamethoxazole-trimethoprim (BACTRIM DS,SEPTRA DS) 800-160 MG tablet    Sig: Take 1 tablet by mouth 2 (two) times daily.    Dispense:  28 tablet    Refill:  0  . oxycodone (OXY-IR) 5 MG capsule    Sig: 1 po q 4-6hrs prn pain    Dispense:  40 capsule    Refill:  0  . sulfamethoxazole-trimethoprim (BACTRIM DS,SEPTRA DS) 800-160 MG tablet    Sig: Take 1 tablet by mouth 2 (two) times daily.    Dispense:  28 tablet    Refill:  0    Imaging: No results found.  PMFS History: Patient Active Problem List   Diagnosis Date Noted  . Septic prepatellar bursitis of left knee 10/09/2017  . Obesity 05/06/2015  . Essential hypertension 05/06/2015  . Lumbago 04/26/2015  . Fibula fracture 04/25/2015   Past  Medical History:  Diagnosis Date  . Neck fracture (HCC) 1986    Family History  Problem Relation Age of Onset  . Stroke Father   . Parkinson's disease Father     Past Surgical History:  Procedure Laterality Date  . IRRIGATION AND DEBRIDEMENT KNEE Left 10/09/2017   Procedure: IRRIGATION AND DEBRIDEMENT KNEE;  Surgeon: Cammy Copaean, Naevia Unterreiner Scott, MD;  Location: [facility];  Service: Orthopedics;  Laterality: Left;   Social History   Occupational History  . Not on file  Tobacco Use  . Smoking status: Current Every Day Smoker    Packs/day: 1.00    Types: Cigarettes  . Smokeless tobacco: Never Used  Substance and Sexual Activity  . Alcohol use: Yes    Comment: "a 12 pack a week"  . Drug use: No  . Sexual activity: Yes

## 2017-10-20 ENCOUNTER — Ambulatory Visit (INDEPENDENT_AMBULATORY_CARE_PROVIDER_SITE_OTHER): Payer: BLUE CROSS/BLUE SHIELD | Admitting: Orthopedic Surgery

## 2017-10-20 ENCOUNTER — Encounter (INDEPENDENT_AMBULATORY_CARE_PROVIDER_SITE_OTHER): Payer: Self-pay | Admitting: Orthopedic Surgery

## 2017-10-20 DIAGNOSIS — M25562 Pain in left knee: Secondary | ICD-10-CM

## 2017-10-21 ENCOUNTER — Encounter (INDEPENDENT_AMBULATORY_CARE_PROVIDER_SITE_OTHER): Payer: Self-pay | Admitting: Orthopedic Surgery

## 2017-10-21 NOTE — Progress Notes (Signed)
   Post-Op Visit Note   [facility]           Date of Birth: 08-13-1965           MRN: [id number] Visit Date: 10/20/2017 PCP: Patient, No Pcp Per   Assessment & Plan:  Chief Complaint:  Chief Complaint  Patient presents with  . Left Knee - Routine Post Op   Visit Diagnoses:  1. Acute pain of left knee     Plan: Caryn BeeKevin is a patient is now about 2 weeks out left knee I&D for prepatellar bursitis.  This did have MRSA.  He is on Septra and has 5 days left.  He is doing well.  Wants to return to work on Monday.  On exam sutures are removed.  No significant erythema or fluctuance.  Plan is return to work and sutures removal.  I will see him back as needed.  Follow-Up Instructions: Return if symptoms worsen or fail to improve.   Orders:  No orders of the defined types were placed in this encounter.  No orders of the defined types were placed in this encounter.   Imaging: No results found.  PMFS History: Patient Active Problem List   Diagnosis Date Noted  . Septic prepatellar bursitis of left knee 10/09/2017  . Obesity 05/06/2015  . Essential hypertension 05/06/2015  . Lumbago 04/26/2015  . Fibula fracture 04/25/2015   Past Medical History:  Diagnosis Date  . Neck fracture (HCC) 1986    Family History  Problem Relation Age of Onset  . Stroke Father   . Parkinson's disease Father     Past Surgical History:  Procedure Laterality Date  . IRRIGATION AND DEBRIDEMENT KNEE Left 10/09/2017   Procedure: IRRIGATION AND DEBRIDEMENT KNEE;  Surgeon: Cammy Copaean, Amandalynn Pitz Scott, MD;  Location: [facility];  Service: Orthopedics;  Laterality: Left;   Social History   Occupational History  . Not on file  Tobacco Use  . Smoking status: Current Every Day Smoker    Packs/day: 1.00    Types: Cigarettes  . Smokeless tobacco: Never Used  Substance and Sexual Activity  . Alcohol use: Yes    Comment: "a 12 pack a week"  . Drug use: No  . Sexual activity: Yes

## 2018-04-16 ENCOUNTER — Emergency Department (HOSPITAL_COMMUNITY)
Admission: EM | Admit: 2018-04-16 | Discharge: 2018-04-16 | Disposition: A | Discharge status: Home / Self Care | Payer: PRIVATE HEALTH INSURANCE | Attending: Emergency Medicine | Admitting: Emergency Medicine

## 2018-04-16 ENCOUNTER — Other Ambulatory Visit: Payer: Self-pay

## 2018-04-16 ENCOUNTER — Emergency Department (HOSPITAL_COMMUNITY): Payer: PRIVATE HEALTH INSURANCE

## 2018-04-16 ENCOUNTER — Encounter (HOSPITAL_COMMUNITY): Payer: Self-pay | Admitting: Emergency Medicine

## 2018-04-16 DIAGNOSIS — Y929 Unspecified place or not applicable: Secondary | ICD-10-CM | POA: Diagnosis not present

## 2018-04-16 DIAGNOSIS — Y939 Activity, unspecified: Secondary | ICD-10-CM | POA: Insufficient documentation

## 2018-04-16 DIAGNOSIS — S99911A Unspecified injury of right ankle, initial encounter: Secondary | ICD-10-CM | POA: Diagnosis present

## 2018-04-16 DIAGNOSIS — Y33XXXA Other specified events, undetermined intent, initial encounter: Secondary | ICD-10-CM | POA: Insufficient documentation

## 2018-04-16 DIAGNOSIS — S93401A Sprain of unspecified ligament of right ankle, initial encounter: Secondary | ICD-10-CM | POA: Diagnosis not present

## 2018-04-16 DIAGNOSIS — T22011A Burn of unspecified degree of right forearm, initial encounter: Secondary | ICD-10-CM | POA: Diagnosis not present

## 2018-04-16 DIAGNOSIS — X17XXXA Contact with hot engines, machinery and tools, initial encounter: Secondary | ICD-10-CM | POA: Diagnosis not present

## 2018-04-16 DIAGNOSIS — F17210 Nicotine dependence, cigarettes, uncomplicated: Secondary | ICD-10-CM | POA: Diagnosis not present

## 2018-04-16 DIAGNOSIS — T22019A Burn of unspecified degree of unspecified forearm, initial encounter: Secondary | ICD-10-CM

## 2018-04-16 DIAGNOSIS — M7731 Calcaneal spur, right foot: Secondary | ICD-10-CM | POA: Insufficient documentation

## 2018-04-16 DIAGNOSIS — Y998 Other external cause status: Secondary | ICD-10-CM | POA: Insufficient documentation

## 2018-04-16 DIAGNOSIS — I10 Essential (primary) hypertension: Secondary | ICD-10-CM | POA: Diagnosis not present

## 2018-04-16 MED ORDER — DOXYCYCLINE HYCLATE 100 MG PO TABS
100.0000 mg | ORAL_TABLET | Freq: Once | ORAL | Status: AC
  Administered 2018-04-16: 100 mg via ORAL
  Filled 2018-04-16: qty 1

## 2018-04-16 MED ORDER — DOXYCYCLINE HYCLATE 100 MG PO CAPS: 100.0000 mg | ORAL_CAPSULE | Freq: Two times a day (BID) | ORAL | 0 refills | Status: DC

## 2018-04-16 MED ORDER — BACITRACIN-NEOMYCIN-POLYMYXIN 400-5-5000 EX OINT
TOPICAL_OINTMENT | Freq: Once | CUTANEOUS | Status: AC
  Administered 2018-04-16: 2 via TOPICAL
  Filled 2018-04-16: qty 2

## 2018-04-16 MED ORDER — TRAMADOL HCL 50 MG PO TABS: 50.0000 mg | ORAL_TABLET | Freq: Four times a day (QID) | ORAL | 0 refills | Status: DC | PRN

## 2018-04-16 NOTE — ED Provider Notes (Signed)
[facility] EMERGENCY DEPARTMENT Provider Note   CSN: [id number] Arrival date & time: 04/16/18  1209     History   Chief Complaint Chief Complaint  Patient presents with  . Foot Pain    HPI Daniel Hanson is a 52 y.o. male.  Patient is a 52 year old male who presents to the emergency department with a complaint of right foot and ankle pain.  The patient states that he was at work on yesterday October for when he rolled his ankle.  He says he has been having pain since that time.  Today the pain was much more severe.  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  He request to have this evaluated as well.  The history is provided by the patient.  Foot Pain  Pertinent negatives include no chest pain, no abdominal pain and no shortness of breath.    Past Medical History:  Diagnosis Date  . Neck fracture [facility]) 1986    Patient Active Problem List   Diagnosis Date Noted  . Septic prepatellar bursitis of left knee 10/09/2017  . Obesity 05/06/2015  . Essential hypertension 05/06/2015  . Lumbago 04/26/2015  . Fibula fracture 04/25/2015    Past Surgical History:  Procedure Laterality Date  . IRRIGATION AND DEBRIDEMENT KNEE Left 10/09/2017   Procedure: IRRIGATION AND DEBRIDEMENT KNEE;  Surgeon: Cammy Copa, MD;  Location: [facility];  Service: Orthopedics;  Laterality: Left;        Home Medications    Prior to Admission medications   Medication Sig Start Date End Date Taking? Authorizing Provider  ibuprofen (ADVIL,MOTRIN) 800 MG tablet Take 1 tablet (800 mg total) by mouth every 8 (eight) hours as needed. 08/06/17   Park Liter, DPM  oxycodone (OXY-IR) 5 MG capsule 1 po q 4-6hrs prn pain 10/13/17   Cammy Copa, MD  sulfamethoxazole-trimethoprim (BACTRIM DS,SEPTRA DS) 800-160 MG tablet Take 1 tablet by mouth 2 (two) times daily. 10/13/17   Cammy Copa, MD    Family History Family History   Problem Relation Age of Onset  . Stroke Father   . Parkinson's disease Father     Social History Social History   Tobacco Use  . Smoking status: Current Every Day Smoker    Packs/day: 1.00    Types: Cigarettes  . Smokeless tobacco: Never Used  Substance Use Topics  . Alcohol use: Yes    Comment: occasional  . Drug use: No     Allergies   Patient has no known allergies.   Review of Systems Review of Systems  Constitutional: Negative for activity change.       All ROS Neg except as noted in HPI  HENT: Negative for nosebleeds.   Eyes: Negative for photophobia and discharge.  Respiratory: Negative for cough, shortness of breath and wheezing.   Cardiovascular: Negative for chest pain and palpitations.  Gastrointestinal: Negative for abdominal pain and blood in stool.  Genitourinary: Negative for dysuria, frequency and hematuria.  Musculoskeletal: Positive for arthralgias. Negative for back pain and neck pain.       Foot/ankle pain  Skin: Negative.        Burn to the upper extremity  Neurological: Negative for dizziness, seizures and speech difficulty.  Psychiatric/Behavioral: Negative for confusion and hallucinations.     Physical Exam Updated Vital Signs BP 109/76 (BP Location: Right Arm)   Pulse 89   Temp 97.6 F (36.4 C) (Temporal)  Resp 18   Ht 5' 10.5" (1.791 m)   Wt 117.9 kg   SpO2 97%   BMI 36.78 kg/m   Physical Exam  Constitutional: He is oriented to person, place, and time. He appears well-developed and well-nourished.  Non-toxic appearance.  HENT:  Head: Normocephalic.  Right Ear: Tympanic membrane and external ear normal.  Left Ear: Tympanic membrane and external ear normal.  Eyes: Pupils are equal, round, and reactive to light. EOM and lids are normal.  Neck: Normal range of motion. Neck supple. Carotid bruit is not present.  Cardiovascular: Normal rate, regular rhythm, normal heart sounds, intact distal pulses and normal pulses.    Pulmonary/Chest: Breath sounds normal. No respiratory distress.  Abdominal: Soft. Bowel sounds are normal. There is no tenderness. There is no guarding.  Musculoskeletal: Normal range of motion.  Lymphadenopathy:       Head (right side): No submandibular adenopathy present.       Head (left side): No submandibular adenopathy present.    He has no cervical adenopathy.  Neurological: He is alert and oriented to person, place, and time. He has normal strength. No cranial nerve deficit or sensory deficit.  Skin: Skin is warm and dry.  Psychiatric: He has a normal mood and affect. His speech is normal.  Nursing note and vitals reviewed.    ED Treatments / Results  Labs (all labs ordered are listed, but only abnormal results are displayed) Labs Reviewed - No data to display  EKG None  Radiology No results found.  Procedures Procedures (including critical care time)  Medications Ordered in ED Medications - No data to display   Initial Impression / Assessment and Plan / ED Course  I have reviewed the triage vital signs and the nursing notes.  Pertinent labs & imaging results that were available during my care of the patient were reviewed by me and considered in my medical decision making (see chart for details).       Final Clinical Impressions(s) / ED Diagnoses MDM  Vital signs reviewed.  Pulse oximetry is 97% on room air, within normal limits by my interpretation.  X-ray of the foot and ankle on the right is negative for fracture or dislocation.  There is noted a calcaneal spur present.  I have asked the patient to use patient to use ibuprofen with breakfast, lunch, dinner, and at bedtime.  Prescription for Ultram given to the patient to use.  The patient has burns on the forearm.  He will use Neosporin dressings to these areas until healed.  Patient also noted to have a calcaneal spur.  I discussed with the patient that this can cause him pain and can also cause him to  have problems with plantar fasciitis.  I have asked him to discuss this with his primary doctor and to be considered for a podiatry consultation.   Final diagnoses:  Sprain of right ankle, unspecified ligament, initial encounter  Burn of forearm, unspecified burn degree, unspecified laterality, initial encounter  Calcaneal spur of foot, right    ED Discharge Orders         Ordered    traMADol (ULTRAM) 50 MG tablet  Every 6 hours PRN     04/16/18 1315    doxycycline (VIBRAMYCIN) 100 MG capsule  2 times daily     04/16/18 1315           Ivery Quale, PA-C 04/16/18 1326    Terrilee Files, MD 04/16/18 1824

## 2018-04-16 NOTE — ED Notes (Signed)
Topical antibiotic applied to healing wounds on arms x 2

## 2018-04-16 NOTE — Discharge Instructions (Addendum)
Your vital signs within normal limits.  Your x-ray is negative for fracture.  It does show a spur on your heel/calcaneal area.  Please discuss this with your doctor for possible podiatry consult.  Please cleanse the wounds on your arms daily with soap and water.  Apply clean Neosporin dressing daily until the wound has completely healed.  Use doxycycline 2 times daily to prevent infections.  Use Tylenol every 4 hours, or ibuprofen every 6 hours for mild pain.  May use Ultram for more severe pain.  Ultram may cause drowsiness, please use this medication with caution.

## 2018-04-16 NOTE — ED Triage Notes (Signed)
Patient C/o right foot and ankle pain. Per patient rolled ankle at work yesterday. 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

## 2018-08-06 ENCOUNTER — Encounter (HOSPITAL_COMMUNITY): Payer: Self-pay | Admitting: Emergency Medicine

## 2018-08-06 ENCOUNTER — Emergency Department (HOSPITAL_COMMUNITY): Payer: Self-pay

## 2018-08-06 ENCOUNTER — Emergency Department (HOSPITAL_COMMUNITY)
Admission: EM | Admit: 2018-08-06 | Discharge: 2018-08-06 | Disposition: A | Discharge status: Home / Self Care | Payer: Self-pay | Attending: Emergency Medicine | Admitting: Emergency Medicine

## 2018-08-06 ENCOUNTER — Other Ambulatory Visit: Payer: Self-pay

## 2018-08-06 DIAGNOSIS — S92211A Displaced fracture of cuboid bone of right foot, initial encounter for closed fracture: Secondary | ICD-10-CM | POA: Insufficient documentation

## 2018-08-06 DIAGNOSIS — S92901A Unspecified fracture of right foot, initial encounter for closed fracture: Secondary | ICD-10-CM

## 2018-08-06 DIAGNOSIS — F17210 Nicotine dependence, cigarettes, uncomplicated: Secondary | ICD-10-CM | POA: Insufficient documentation

## 2018-08-06 DIAGNOSIS — Y939 Activity, unspecified: Secondary | ICD-10-CM | POA: Insufficient documentation

## 2018-08-06 DIAGNOSIS — S92024A Nondisplaced fracture of anterior process of right calcaneus, initial encounter for closed fracture: Secondary | ICD-10-CM | POA: Insufficient documentation

## 2018-08-06 DIAGNOSIS — Y990 Civilian activity done for income or pay: Secondary | ICD-10-CM | POA: Insufficient documentation

## 2018-08-06 DIAGNOSIS — I10 Essential (primary) hypertension: Secondary | ICD-10-CM | POA: Insufficient documentation

## 2018-08-06 DIAGNOSIS — X500XXA Overexertion from strenuous movement or load, initial encounter: Secondary | ICD-10-CM | POA: Insufficient documentation

## 2018-08-06 DIAGNOSIS — Y9259 Other trade areas as the place of occurrence of the external cause: Secondary | ICD-10-CM | POA: Insufficient documentation

## 2018-08-06 MED ORDER — IBUPROFEN 600 MG PO TABS: 600.0000 mg | ORAL_TABLET | Freq: Four times a day (QID) | ORAL | 0 refills | Status: DC

## 2018-08-06 MED ORDER — PROMETHAZINE HCL 12.5 MG PO TABS: 12.5000 mg | ORAL_TABLET | Freq: Four times a day (QID) | ORAL | 0 refills | Status: DC | PRN

## 2018-08-06 MED ORDER — PROMETHAZINE HCL 12.5 MG PO TABS
12.5000 mg | ORAL_TABLET | Freq: Once | ORAL | Status: AC
  Administered 2018-08-06: 12.5 mg via ORAL
  Filled 2018-08-06: qty 1

## 2018-08-06 MED ORDER — OXYCODONE-ACETAMINOPHEN 5-325 MG PO TABS: 1.0000 | ORAL_TABLET | Freq: Four times a day (QID) | ORAL | 0 refills | Status: DC | PRN

## 2018-08-06 MED ORDER — KETOROLAC TROMETHAMINE 10 MG PO TABS
10.0000 mg | ORAL_TABLET | Freq: Once | ORAL | Status: AC
  Administered 2018-08-06: 10 mg via ORAL
  Filled 2018-08-06: qty 1

## 2018-08-06 MED ORDER — HYDROCODONE-ACETAMINOPHEN 5-325 MG PO TABS
2.0000 | ORAL_TABLET | Freq: Once | ORAL | Status: AC
Start: 2018-08-06 — End: 2018-08-06
  Administered 2018-08-06: 2 via ORAL
  Filled 2018-08-06: qty 2

## 2018-08-06 NOTE — ED Triage Notes (Signed)
Pt c/o R ankle pain for 4 months after work injury. Had it x-rayed upon original injury and it was negative. No gait abnormalities in triage.

## 2018-08-06 NOTE — Discharge Instructions (Addendum)
The CT scan of your foot shows multiple fractures involving your heel, and bones around your heel.  There is 1 of the tendons that is also entrapped that will need podiatry attention.  Please call Dr.Stover or a member of her team as soon as possible for podiatry evaluation and management.  Please use the cam walker and crutches until you are seen by the specialist.  Please keep as much weight as possible off of the foot.  Please elevate your foot above your waist when you are sitting and above your heart when you are lying down. Please use 600 mg of ibuprofen with breakfast, lunch, dinner, and at bedtime.  Use Percocet 1 every 6 hours for pain.  Use promethazine every 6 hours for nausea if needed.

## 2018-08-06 NOTE — ED Provider Notes (Addendum)
[facility] EMERGENCY DEPARTMENT Provider Note   CSN: [id number] Arrival date & time: 08/06/18  1346     History   Chief Complaint Chief Complaint  Patient presents with  . Ankle Pain    HPI Daniel Hanson is a 53 y.o. male.  Patient is a 53 year old male who presents to the emergency department with right foot and ankle pain.  The patient states that approximately 4 months ago he rolled his ankle at work.  He was evaluated in the emergency department.  There was no fracture or dislocation.  He states that since that time he is been having increasing pain, and swelling.  Today he says that he had pain that was nearly unbearable.  He says that the swelling is not going down.  He presents for evaluation of his foot and ankle pain.  The patient denies any new injury since that time.  He has not had any operations or procedures involving his foot or ankle.  The history is provided by the patient.  Ankle Pain  Associated symptoms: no back pain and no neck pain     Past Medical History:  Diagnosis Date  . Neck fracture [facility](HCC) 1986    Patient Active Problem List   Diagnosis Date Noted  . Septic prepatellar bursitis of left knee 10/09/2017  . Obesity 05/06/2015  . Essential hypertension 05/06/2015  . Lumbago 04/26/2015  . Fibula fracture 04/25/2015    Past Surgical History:  Procedure Laterality Date  . IRRIGATION AND DEBRIDEMENT KNEE Left 10/09/2017   Procedure: IRRIGATION AND DEBRIDEMENT KNEE;  Surgeon: Cammy Copaean, Gregory Scott, MD;  Location: [facility] ORS;  Service: Orthopedics;  Laterality: Left;        Home Medications    Prior to Admission medications   Medication Sig Start Date End Date Taking? Authorizing Provider  doxycycline (VIBRAMYCIN) 100 MG capsule Take 1 capsule (100 mg total) by mouth 2 (two) times daily. 04/16/18   Ivery QualeBryant, Salimata Christenson, PA-C  ibuprofen (ADVIL,MOTRIN) 800 MG tablet Take 1 tablet (800 mg total) by mouth every 8 (eight) hours as needed. 08/06/17   Park LiterPrice,  Michael J, DPM  oxycodone (OXY-IR) 5 MG capsule 1 po q 4-6hrs prn pain 10/13/17   Cammy Copaean, Gregory Scott, MD  sulfamethoxazole-trimethoprim (BACTRIM DS,SEPTRA DS) 800-160 MG tablet Take 1 tablet by mouth 2 (two) times daily. 10/13/17   Cammy Copaean, Gregory Scott, MD  traMADol (ULTRAM) 50 MG tablet Take 1 tablet (50 mg total) by mouth every 6 (six) hours as needed. 04/16/18   Ivery QualeBryant, Kymberli Wiegand, PA-C    Family History Family History  Problem Relation Age of Onset  . Stroke Father   . Parkinson's disease Father     Social History Social History   Tobacco Use  . Smoking status: Current Every Day Smoker    Packs/day: 1.00    Types: Cigarettes  . Smokeless tobacco: Never Used  Substance Use Topics  . Alcohol use: Yes    Comment: occasional  . Drug use: No     Allergies   Patient has no known allergies.   Review of Systems Review of Systems  Constitutional: Negative for activity change.       All ROS Neg except as noted in HPI  HENT: Negative for nosebleeds.   Eyes: Negative for photophobia and discharge.  Respiratory: Negative for cough, shortness of breath and wheezing.   Cardiovascular: Negative for chest pain and palpitations.  Gastrointestinal: Negative for abdominal pain and blood in stool.  Genitourinary: Negative for dysuria, frequency and  hematuria.  Musculoskeletal: Positive for arthralgias. Negative for back pain and neck pain.  Skin: Negative.   Neurological: Negative for dizziness, seizures and speech difficulty.  Psychiatric/Behavioral: Negative for confusion and hallucinations.     Physical Exam Updated Vital Signs BP (!) 145/87 (BP Location: Right Arm)   Pulse 99   Temp 98.8 F (37.1 C) (Oral)   Resp 18   Ht 5\' 11"  (1.803 m)   Wt 120.2 kg   SpO2 97%   BMI 36.96 kg/m   Physical Exam Vitals signs and nursing note reviewed.  Constitutional:      Appearance: He is well-developed. He is not toxic-appearing.  HENT:     Head: Normocephalic.     Right Ear: Tympanic  membrane and external ear normal.     Left Ear: Tympanic membrane and external ear normal.  Eyes:     General: Lids are normal.     Pupils: Pupils are equal, round, and reactive to light.  Neck:     Musculoskeletal: Normal range of motion and neck supple.     Vascular: No carotid bruit.  Cardiovascular:     Rate and Rhythm: Normal rate and regular rhythm.     Pulses: Normal pulses.     Heart sounds: Normal heart sounds.  Pulmonary:     Effort: No respiratory distress.     Breath sounds: Normal breath sounds.  Abdominal:     General: Bowel sounds are normal.     Palpations: Abdomen is soft.     Tenderness: There is no abdominal tenderness. There is no guarding.  Musculoskeletal: Normal range of motion.     Comments: There is full range of motion of the right hip and knee.  There is no deformity of the tibial area.  There is swelling from the ankle to the foot on the right.  There is lateral and medial area tenderness to palpation.  The Achilles tendon is intact.  The dorsalis pedis pulses 2+.  The capillary refill on the right is less than 2 seconds.  There are no lesions noted between the toes.  There is no puncture wound noted of the plantar surface.  Lymphadenopathy:     Head:     Right side of head: No submandibular adenopathy.     Left side of head: No submandibular adenopathy.     Cervical: No cervical adenopathy.  Skin:    General: Skin is warm and dry.  Neurological:     Mental Status: He is alert and oriented to person, place, and time.     Cranial Nerves: No cranial nerve deficit.     Sensory: No sensory deficit.  Psychiatric:        Speech: Speech normal.      ED Treatments / Results  Labs (all labs ordered are listed, but only abnormal results are displayed) Labs Reviewed - No data to display  EKG None  Radiology Dg Ankle Complete Right  Result Date: 08/06/2018 CLINICAL DATA:  Right ankle pain for the past 4 months following an injury. EXAM: RIGHT ANKLE -  COMPLETE 3+ VIEW COMPARISON:  04/16/2018. FINDINGS: Interval diffuse soft tissue swelling, more pronounced laterally. Interval visualization of a minimally displaced fracture in the inferior aspect of the anterior portion of the calcaneus. There is also interval visualization of a mildly comminuted fracture in the inferior aspect of the cuboid with proximal displacement and rotation of the major inferior fragment and inferior displacement of a smaller fragment. Interval fracture in the anterior aspect  of the previously demonstrated moderately large inferior calcaneal spur. No ankle fracture or dislocation. IMPRESSION: Interval visualization of anterior calcaneus, inferior cuboid and inferior calcaneal spur fractures, as described above, with associated diffuse soft tissue swelling. Electronically Signed   By: Beckie Salts M.D.   On: 08/06/2018 15:24   Ct Foot Right Wo Contrast  Result Date: 08/06/2018 CLINICAL DATA:  Right ankle pain for 4 months. Calcaneal and cuboid fractures on recent radiography. EXAM: CT OF THE RIGHT FOOT WITHOUT CONTRAST TECHNIQUE: Multidetector CT imaging of the right foot was performed according to the standard protocol. Multiplanar CT image reconstructions were also generated. COMPARISON:  08/06/2018 FINDINGS: Bones/Joint/Cartilage Nondisplaced fracture noted with discontinuity along the anterior-inferior calcaneal cortex for example image 83/6, with the more cephalad aspect of the fracture plane not visible. There is likely an avulsion of the anterior process of the calcaneus on image 106/6. There is also a very large plantar calcaneal spur. Comminuted fracture of the cuboid, with fractures extending into the articular surfaces with the calcaneus and lateral cuneiform. Mildly displaced inferolateral fragment. Oblique fracture of the proximal third metatarsal extends into the articular surface and into the plantar metaphysis. Small fragment along the distal fracture plane margin. Small  avulsion fracture of the navicular is present along the proximal central portion in the vicinity of the attachment of the inferoplantar longitudinal portion of the spring ligament. There also small bony fragments in between the navicular and the cuboid. There are chronic appearing bony deformities of the distal phalanx of the great toe with irregular proximal spurring along the base of the distal phalanx. Ligaments Suboptimally assessed by CT. The Lisfranc ligament appears to likely be intact on image 68/7. There is abnormal thickening of the superomedial portion of the spring ligament with surrounding edema. Muscles and Tendons Thickened appearance of the distal tibialis posterior tendon probably from tendinopathy. The peroneus longus tendon is partially entrapped between the fragments of the cuboid for example on image 83/7. Edema tracks along the plantar musculature of the foot. Notably thickened plantar fascia. Soft tissues Subcutaneous edema along the plantar lateral margin of the fifth metatarsal, cause uncertain. Subcutaneous edema tracks in the dorsum of the foot. There is also subcutaneous edema along the heel. As expected there is edema signal in the sinus tarsi. IMPRESSION: 1. Various fractures are observed including comminuted fracture of the cuboid, which partially entraps the peroneus longus tendon; avulsion of the anterior process of the calcaneus; nondisplaced fracture of the anterior inferior calcaneal margin with indistinct cephalad extension; avulsion fracture of the navicular along the attachment site of the inferoplantar longitudinal portion of the spring ligament; and oblique fracture of the proximal third metatarsal involving the articular surface and plantar metaphysis. There are also small bony fragments between the navicular and the cuboid, and chronic appearing bony deformities of the base of the distal phalanx great toe. 2. A do not observe obvious discontinuity of the Lisfranc joint, and  the only metatarsal fracture is of the base of the third metatarsal. The cuboid fractures extend into the articulations with the calcaneus and navicular as well as the lateral cuneiform, but not into the distal articular surface with the fourth and fifth metatarsal bases. 3. Thickened superomedial portion of the spring ligament with surrounding edema. 4. Thickened medial band of the plantar fascia. 5. Thickened distal tibialis posterior tendon probably from tendinopathy. 6. Edema is present within the sinus tarsi. Electronically Signed   By: Gaylyn Rong M.D.   On: 08/06/2018 17:30  Procedures Procedures (including critical care time) FRACTURE CARE RIGHT FOOT. Patient presents to the emergency department with nearly 4 months of ankle pain.  X-ray showed calcaneal and cuboid fractures.  A CT scan showed multiple fractures.  I have discussed the CT scan with the patient in terms of which he understands.  I discussed immobilization, and the patient gives permission.  Procedural timeout taken.  The patient was identified by armband.  Patient was fitted with a cam walker.  Crutches were offered.  Patient will be treated for pain with ibuprofen and Percocet.  Patient tolerated the procedure without problem.  Medications Ordered in ED Medications  HYDROcodone-acetaminophen (NORCO/VICODIN) 5-325 MG per tablet 2 tablet (2 tablets Oral Given 08/06/18 1639)  promethazine (PHENERGAN) tablet 12.5 mg (12.5 mg Oral Given 08/06/18 1639)  ketorolac (TORADOL) tablet 10 mg (10 mg Oral Given 08/06/18 1639)     Initial Impression / Assessment and Plan / ED Course  I have reviewed the triage vital signs and the nursing notes.  Pertinent labs & imaging results that were available during my care of the patient were reviewed by me and considered in my medical decision making (see chart for details).       Final Clinical Impressions(s) / ED Diagnoses MDM  Vital signs reviewed.  Pulse oximetry is 97% on  room air.  Within normal limits by my interpretation.  No neurovascular deficit appreciated on examination.  Will recheck x-ray.  X-ray shows interval visualization of anterior calcaneus inferior cuboid and inferior calcaneal spur fractures.  There is also noted diffuse soft tissue swelling.  Patient treated in the emergency department with medication for pain.  I have discussed this with the patient.  Will obtain CT scan for better visualization.  CT scan shows various fractures observed including a comminuted fracture of the cuboid which partially entrapped the Perrone us longus tendon.  There is an avulsion of the anterior process of the calcaneus.  There is a nondisplaced fracture of the anterior inferior calcaneal margin.  There is an avulsion fracture of the navicular along the attachment site of the inferior plantar longitudinal portion of the spring ligament.  There is an oblique fracture of the proximal third metatarsal involving the articular surface.  There are also small bony fragments between the navicular and the cuboid.  There is a chronic appearing bony deformity of the base of the distal phalanx of the great toe.  No discontinuous T of the Lisfranc joint was appreciated at this time.  I have discussed the CT scan results with the patient in terms which he understands.  The patient states he does not recall any additional injury since the October "rolled ankle" that he was evaluated for.  He has not had any operations or procedures.  The patient is referred to [facility] [place].  The patient is fitted with a cam walker.  Prescription for ibuprofen, Percocet, and promethazine given to the patient.  Patient is in agreement with this plan.   Final diagnoses:  Multiple closed fractures of right foot, initial encounter    ED Discharge Orders         Ordered    promethazine (PHENERGAN) 12.5 MG tablet  Every 6 hours PRN     08/06/18 1817     oxyCODONE-acetaminophen (PERCOCET/ROXICET) 5-325 MG tablet  Every 6 hours PRN     08/06/18 1817    ibuprofen (ADVIL,MOTRIN) 600 MG tablet  4 times daily,   Status:  Discontinued     08/06/18 1817    ibuprofen (ADVIL,MOTRIN) 600 MG tablet  4 times daily     08/06/18 1821           Ivery Quale, PA-C 08/08/18 2205    Ivery Quale, PA-C 08/08/18 2210    Loren Racer, MD 08/08/18 [phone]

## 2018-08-08 MED FILL — IBUPROFEN 600 MG TABLET: 600 | 7 days supply | Qty: 30 | Fill #0

## 2018-08-12 ENCOUNTER — Ambulatory Visit (INDEPENDENT_AMBULATORY_CARE_PROVIDER_SITE_OTHER): Payer: Worker's Compensation

## 2018-08-12 ENCOUNTER — Ambulatory Visit (INDEPENDENT_AMBULATORY_CARE_PROVIDER_SITE_OTHER): Payer: Worker's Compensation | Admitting: Podiatry

## 2018-08-12 ENCOUNTER — Encounter: Payer: Self-pay | Admitting: Podiatry

## 2018-08-12 DIAGNOSIS — S82891D Other fracture of right lower leg, subsequent encounter for closed fracture with routine healing: Secondary | ICD-10-CM

## 2018-08-12 DIAGNOSIS — S9304XA Dislocation of right ankle joint, initial encounter: Secondary | ICD-10-CM | POA: Diagnosis not present

## 2018-08-12 DIAGNOSIS — S92334A Nondisplaced fracture of third metatarsal bone, right foot, initial encounter for closed fracture: Secondary | ICD-10-CM | POA: Diagnosis not present

## 2018-08-12 DIAGNOSIS — S92021A Displaced fracture of anterior process of right calcaneus, initial encounter for closed fracture: Secondary | ICD-10-CM | POA: Diagnosis not present

## 2018-08-12 DIAGNOSIS — M25571 Pain in right ankle and joints of right foot: Secondary | ICD-10-CM

## 2018-08-12 DIAGNOSIS — S92251A Displaced fracture of navicular [scaphoid] of right foot, initial encounter for closed fracture: Secondary | ICD-10-CM | POA: Diagnosis not present

## 2018-08-12 DIAGNOSIS — IMO0001 Reserved for inherently not codable concepts without codable children: Secondary | ICD-10-CM

## 2018-08-12 DIAGNOSIS — S92211A Displaced fracture of cuboid bone of right foot, initial encounter for closed fracture: Secondary | ICD-10-CM

## 2018-08-12 DIAGNOSIS — M25471 Effusion, right ankle: Secondary | ICD-10-CM

## 2018-08-12 MED ORDER — OXYCODONE-ACETAMINOPHEN 5-325 MG PO TABS: 1.0000 | ORAL_TABLET | Freq: Four times a day (QID) | ORAL | 0 refills | Status: DC | PRN

## 2018-08-12 NOTE — Progress Notes (Signed)
Subjective:  Patient ID: Daniel Hanson, male    DOB: 02-04-1966,  MRN: [id number]  Chief Complaint  Patient presents with  . Fracture    follow up ankle fracture   53 y.o. male presents with the above complaint.  Here for ER follow-up.  Was seen in the ED for lateral ankle pain CT was performed was found to have multiple fractures of the right foot.  Original injury 04/15/2018. States that he rolled his right ankle at work was seen in the ER initially without any evident fracture continue to have pain and was seen again pain only on 08/06/2018.  Today he complains of right ankle pain in the outside presents wearing a cam walker boot and here for review of the CT possible surgery.  Of note he was formerly being seen for issue to his right great toe it is causing no pain or issues today.  Review of Systems: Negative except as noted in the HPI. Denies N/V/F/Ch.  Past Medical History:  Diagnosis Date  . Neck fracture (HCC) 1986    Current Outpatient Medications:  .  doxycycline (VIBRA-TABS) 100 MG tablet, TAKE 1 CAPSULE (100 MG TOTAL) BY MOUTH 2 (TWO) TIMES DAILY., Disp: , Rfl:  .  doxycycline (VIBRAMYCIN) 100 MG capsule, Take 1 capsule (100 mg total) by mouth 2 (two) times daily., Disp: 14 capsule, Rfl: 0 .  ibuprofen (ADVIL,MOTRIN) 600 MG tablet, Take 1 tablet (600 mg total) by mouth 4 (four) times daily., Disp: 30 tablet, Rfl: 0 .  oxycodone (OXY-IR) 5 MG capsule, 1 po q 4-6hrs prn pain, Disp: 40 capsule, Rfl: 0 .  oxyCODONE-acetaminophen (PERCOCET/ROXICET) 5-325 MG tablet, Take 1 tablet by mouth every 6 (six) hours as needed., Disp: 12 tablet, Rfl: 0 .  promethazine (PHENERGAN) 12.5 MG tablet, Take 1 tablet (12.5 mg total) by mouth every 6 (six) hours as needed., Disp: 12 tablet, Rfl: 0 .  sulfamethoxazole-trimethoprim (BACTRIM DS,SEPTRA DS) 800-160 MG tablet, Take 1 tablet by mouth 2 (two) times daily., Disp: 28 tablet, Rfl: 0 .  traMADol (ULTRAM) 50 MG tablet, Take 1 tablet (50  mg total) by mouth every 6 (six) hours as needed., Disp: 12 tablet, Rfl: 0  Social History   Tobacco Use  Smoking Status Current Every Day Smoker  . Packs/day: 1.00  . Types: Cigarettes  Smokeless Tobacco Never Used    No Known Allergies Objective:  There were no vitals filed for this visit. There is no height or weight on file to calculate BMI. Constitutional Well developed. Well nourished.  Vascular Dorsalis pedis pulses palpable bilaterally. Posterior tibial pulses palpable bilaterally. Capillary refill normal to all digits.  No cyanosis or clubbing noted. Pedal hair growth normal.  Neurologic Normal speech. Oriented to person, place, and time. Epicritic sensation to light touch grossly present bilaterally.  Dermatologic Nails well groomed and normal in appearance. No open wounds. No skin lesions.  Orthopedic: POP about the CC joint, cuboid body, pain along the peroneals.  No pain about the navicular or about the third metatarsal.   Radiographs: Taken and reviewed cuboid and anterior calcaneal fractures noted. Assessment:   1. Closed displaced fracture of cuboid of right foot, initial encounter   2. Subluxation of peroneal tendon of right foot, initial encounter   3. Displaced fracture of anterior process of right calcaneus, initial encounter for closed fracture   4. Closed nondisplaced fracture of third metatarsal bone of right foot, initial encounter   5. Pain and swelling of ankle, right  Plan:  Patient was evaluated and treated and all questions answered.  Cuboid Fracture with peroneal entrapment right, anterior calcaneus fracture -CT reviewed with patient.  Multiple fractures noted include comminuted fracture of the cuboid with entrapment of the peroneus longus tendon, avulsion fracture of the navicular, proximal third metatarsal fracture, small navicular avulsion fracture, anterior avulsion fracture of the calcaneus. -All the above fractures patient is only  having pain at the lateral ankle. -Discussed benefits of surgical intervention   -Continue cam boot immobilization -Patient has failed all conservative therapy and wishes to proceed with surgical intervention. All risks, benefits, and alternatives discussed with patient. No guarantees given. Consent reviewed and signed by patient. -Planned procedures: Right open reduction internal fixation of cuboid fracture versus excision of avulsed fragments with relocation of peroneal tendons, possible excision of calcaneal avulsion fracture fragment.  Closed Fracture Right 3rd Metatarsal -CT reviewed: oblique fracture of the proximal third metatarsal involving the articular surface and plantar metaphysis -No pain on exam today -Will proceed with non-surgical management -WBAT in CAM boot.  Navicular Avulsion Fracture -CT reviewed: Avulsion fracture navicular along the attachment of the infero-plantar longitudinal portion of the spring ligament -No pain on exam today -Will proceed with non-surgical management -WBAT in CAM boot  Return for post op.

## 2018-08-12 NOTE — Patient Instructions (Signed)
Pre-Operative Instructions  Congratulations, you have decided to take an important step towards improving your quality of life.  You can be assured that the doctors and staff at [facility] will be with you every step of the way.  Here are some important things you should know:  1. Plan to be at the surgery center/hospital at least 1 (one) hour prior to your scheduled time, unless otherwise directed by the surgical center/hospital staff.  You must have a responsible adult accompany you, remain during the surgery and drive you home.  Make sure you have directions to the surgical center/hospital to ensure you arrive on time. 2. If you are having surgery at [facility], you will need a copy of your medical history and physical form from your family physician within one month prior to the date of surgery. We will give you a form for your primary physician to complete.  3. We make every effort to accommodate the date you request for surgery.  However, there are times where surgery dates or times have to be moved.  We will contact you as soon as possible if a change in schedule is required.   4. No aspirin/ibuprofen for one week before surgery.  If you are on aspirin, any non-steroidal anti-inflammatory medications (Mobic, Aleve, Ibuprofen) should not be taken seven (7) days prior to your surgery.  You make take Tylenol for pain prior to surgery.  5. Medications - If you are taking daily heart and blood pressure medications, seizure, reflux, allergy, asthma, anxiety, pain or diabetes medications, make sure you notify the surgery center/hospital before the day of surgery so they can tell you which medications you should take or avoid the day of surgery. 6. No food or drink after midnight the night before surgery unless directed otherwise by surgical center/hospital staff. 7. No alcoholic beverages 24-hours prior to surgery.  No smoking 24-hours prior or 24-hours after  surgery. 8. Wear loose pants or shorts. They should be loose enough to fit over bandages, boots, and casts. 9. Don't wear slip-on shoes. Sneakers are preferred. 10. Bring your boot with you to the surgery center/hospital.  Also bring crutches or a walker if your physician has prescribed it for you.  If you do not have this equipment, it will be provided for you after surgery. 11. If you have not been contacted by the surgery center/hospital by the day before your surgery, call to confirm the date and time of your surgery. 12. Leave-time from work may vary depending on the type of surgery you have.  Appropriate arrangements should be made prior to surgery with your employer. 13. Prescriptions will be provided immediately following surgery by your doctor.  Fill these as soon as possible after surgery and take the medication as directed. Pain medications will not be refilled on weekends and must be approved by the doctor. 14. Remove nail polish on the operative foot and avoid getting pedicures prior to surgery. 15. Wash the night before surgery.  The night before surgery wash the foot and leg well with water and the antibacterial soap provided. Be sure to pay special attention to beneath the toenails and in between the toes.  Wash for at least three (3) minutes. Rinse thoroughly with water and dry well with a towel.  Perform this wash unless told not to do so by your physician.  Enclosed: 1 Ice pack (please put in freezer the night before surgery)   1 Hibiclens skin cleaner     Pre-op instructions  If you have any questions regarding the instructions, please do not hesitate to call our office.  [place]: [address], [place], [place] [address] -- [phone]  [place]: [address] [place], [place] [address] -- [phone]  [place]: [address]  [place], [place] [address] -- [phone]  [place]: [address], [address], [place], [place] [address] -- [phone]  Website:  [email or web address] 

## 2018-08-17 ENCOUNTER — Telehealth: Payer: Self-pay | Admitting: *Deleted

## 2018-08-17 NOTE — Telephone Encounter (Signed)
"  I'm calling to see when Dr. Samuella Cota has me scheduled for surgery?"  Do you have a date in mind that you would like?  Dr. Samuella Cota said to schedule you within the next three weeks.  He does surgeries on Wednesdays.  "Can he do it next week?"  He does not have anything available next week.  He can do it on August 31, 2018.  "That date will be fine."  Someone from the surgical center will call you a day or two prior to your surgical date.  They will give you your arrival time.  You need to go online and register with the surgical center.  "Do I have to do it online or can I just call them?"  If you don't have access to a computer, someone from the surgical center will call you and ask you the needed information.  (Dr. Ardelle Anton assists Dr. Samuella Cota on 08/31/18.)

## 2018-08-23 ENCOUNTER — Telehealth: Payer: Self-pay | Admitting: *Deleted

## 2018-08-23 MED ORDER — OXYCODONE-ACETAMINOPHEN 5-325 MG PO TABS: 1.0000 | ORAL_TABLET | Freq: Four times a day (QID) | ORAL | 0 refills | Status: DC | PRN

## 2018-08-23 NOTE — Telephone Encounter (Signed)
Pt states he is scheduled for surgery next Wednesday, and Dr. Samuella Cota prescribed #12 Percocet, he would like a refill.

## 2018-08-23 NOTE — Telephone Encounter (Signed)
"  I haven't heard what time I need to be there for my surgery on February 19 with Dr. Samuella Cota."  Someone from the surgical center will call you a day or two prior to your surgery date and they will give you your arrival time.  "I was given a prescription for some pain pills.  I have ran out.  Can I get a refill on that since my surgery is a week away?"  I need to transfer you to the nurse regarding that question.  I transferred him to [place].

## 2018-08-23 NOTE — Telephone Encounter (Signed)
left message informing pt the prescription had been refilled.

## 2018-08-24 ENCOUNTER — Telehealth: Payer: Self-pay | Admitting: *Deleted

## 2018-08-24 NOTE — Telephone Encounter (Addendum)
"  I'm calling on behalf of Sung Theune.  He's scheduled for an upcoming surgery.  I'm trying to get him registered with the surgical center.  Which link do I use, the [employer] link or [employer]?"  Who am I speaking with?  "This is Sherald Hess, I'm his girlfriend.  There's a correction on the front of the brochure do I use that link or the one on the blue page?"  Use the link that is on the front of the brochure.  "Okay, thank you."

## 2018-08-25 ENCOUNTER — Telehealth: Payer: Self-pay | Admitting: Podiatry

## 2018-08-25 NOTE — Telephone Encounter (Signed)
Left message for Daniel Hanson to call me back so I can get authorization through workers' compensation for pt's surgery scheduled on Wednesday, 19 February.

## 2018-08-30 ENCOUNTER — Telehealth: Payer: Self-pay | Admitting: Podiatry

## 2018-08-30 ENCOUNTER — Telehealth: Payer: Self-pay | Admitting: *Deleted

## 2018-08-30 NOTE — Telephone Encounter (Signed)
I'm calling for [facility]. He's scheduled to have surgery tomorrow, on 2/19 and I was calling to see if you had gotten approval from the insurance company yet. If you could, please give him a call back at [phone]. Thank you and have a good day.

## 2018-08-30 NOTE — Telephone Encounter (Signed)
I called and spoke with Daniel Hanson, new case adjuster to follow up if workers' compensation was going to pay for patients surgery. [name] stated the surgery was not authorized and that she has notified both the patient as well as the surgery center.

## 2018-08-30 NOTE — Telephone Encounter (Signed)
Daniel Hanson, I am returning your call.  "Daniel Hanson's worker's comp has denied his surgery.  Does Dr. Samuella Cota want to appeal it?  I think they said it's not related to his injury."  I am not sure.  I will let him know.  "I am calling to cancel my surgery for tomorrow.  My worker's comp has denied it.  I don't know what I'm going to have to do now.  I guess I'll have to fight it."  I will cancel the surgery.

## 2018-08-30 NOTE — Telephone Encounter (Signed)
Postop appointments have been canceled.  °

## 2018-09-08 ENCOUNTER — Other Ambulatory Visit: Payer: Worker's Compensation

## 2018-09-09 ENCOUNTER — Other Ambulatory Visit: Payer: Worker's Compensation

## 2019-01-20 IMAGING — CR DG KNEE COMPLETE 4+V*L*
4 series · 4 of 4 positions shown · non-contrast
Comparison: None.

CLINICAL DATA: Left knee pain.  Redness and swelling for 3 weeks.

EXAM:
LEFT KNEE - COMPLETE 4+ VIEW

[x knee ap left (1 of 4)]
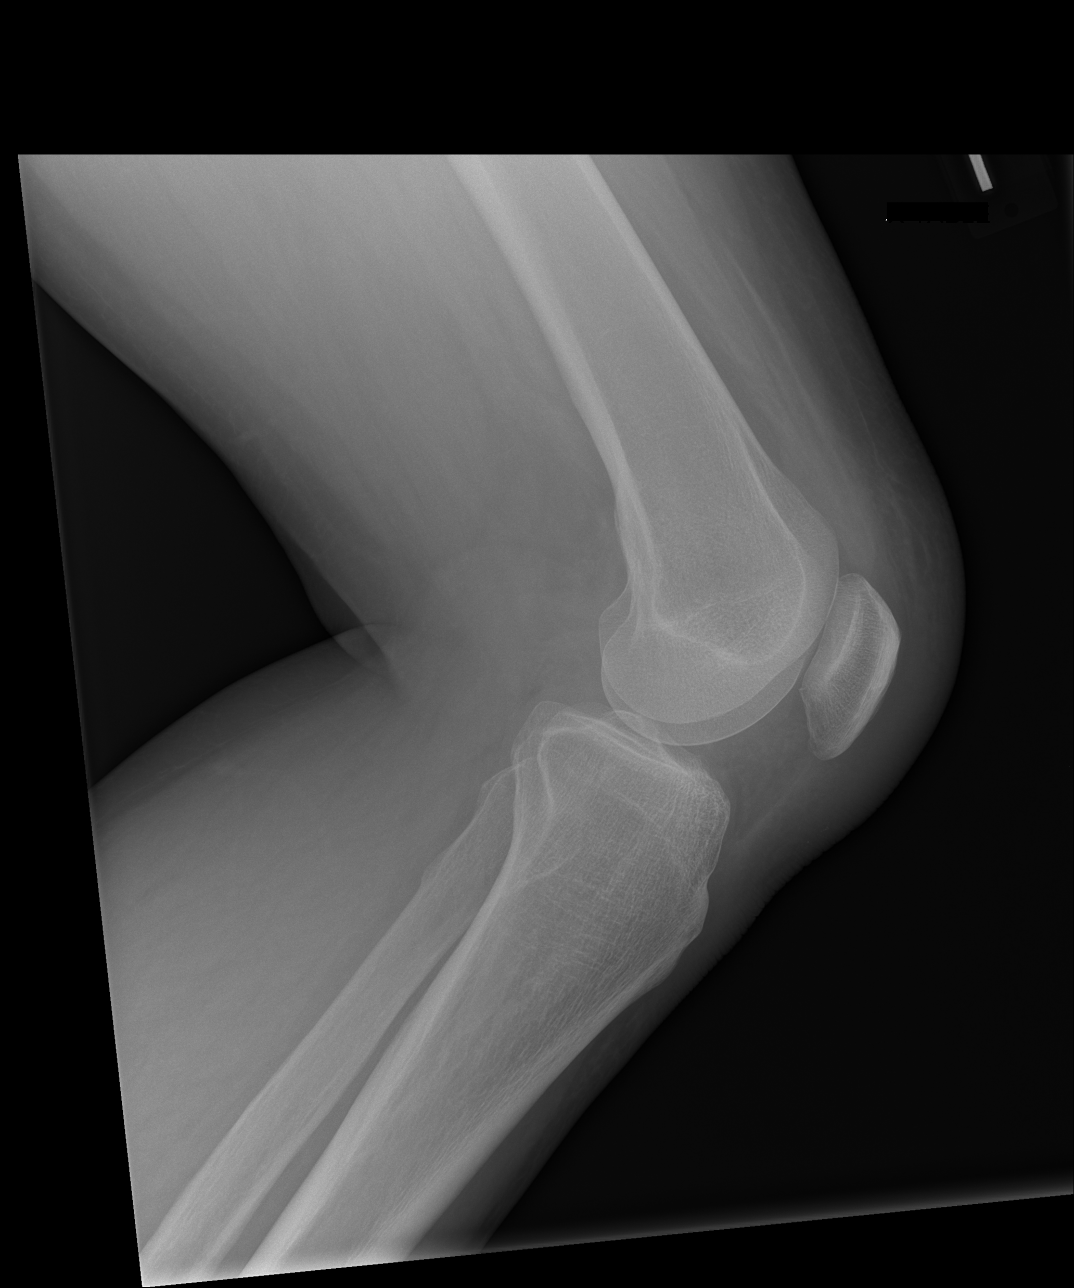

[x knee ap left (2 of 4)]
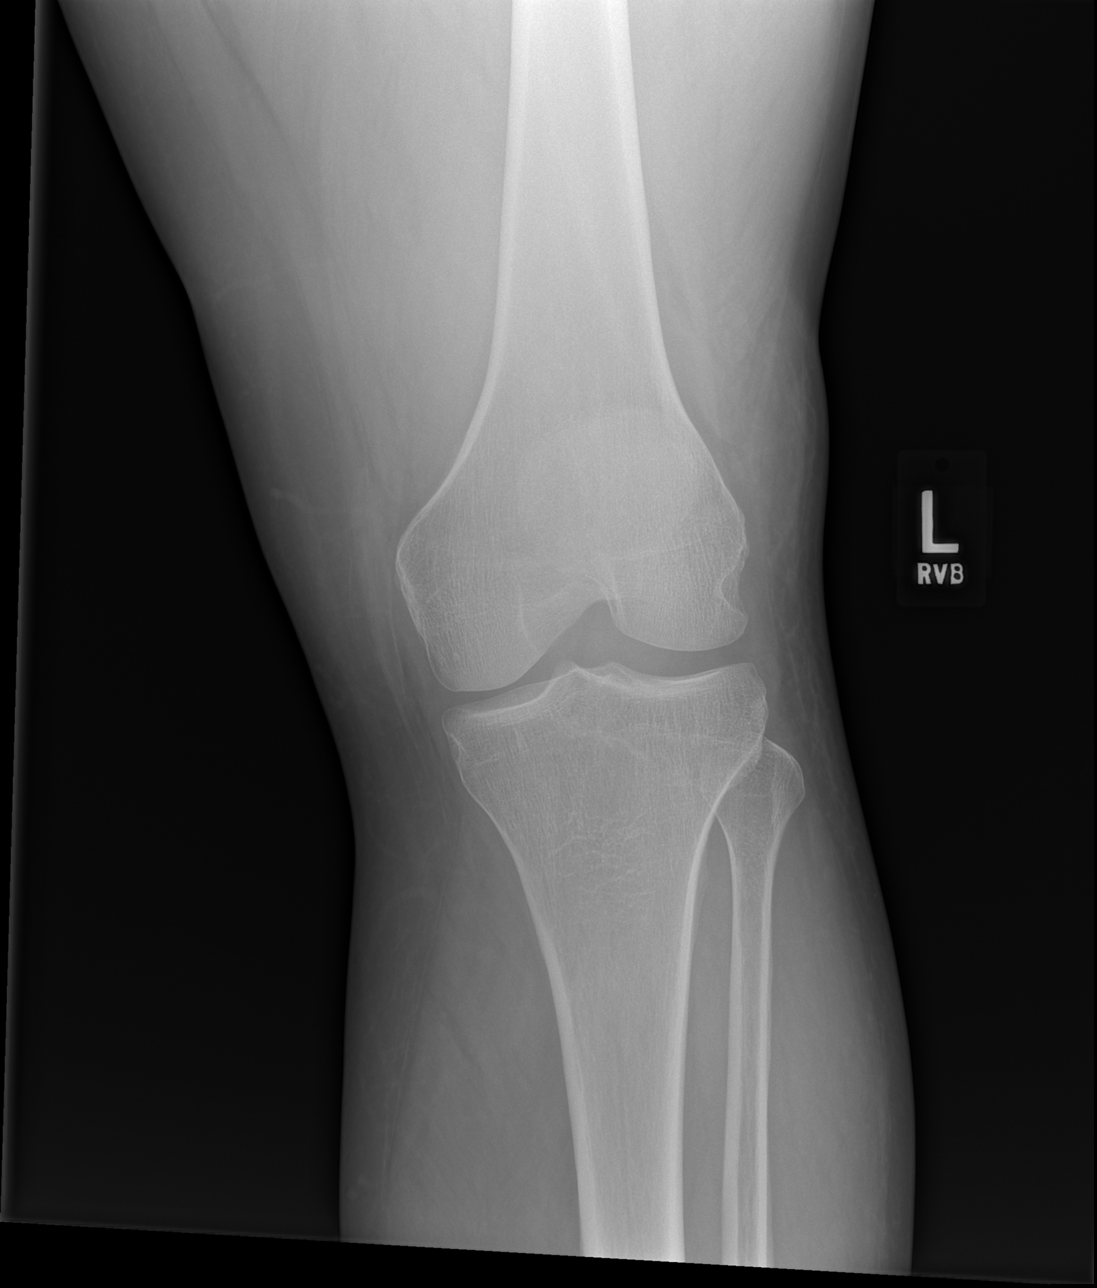

[x knee ap left (3 of 4)]
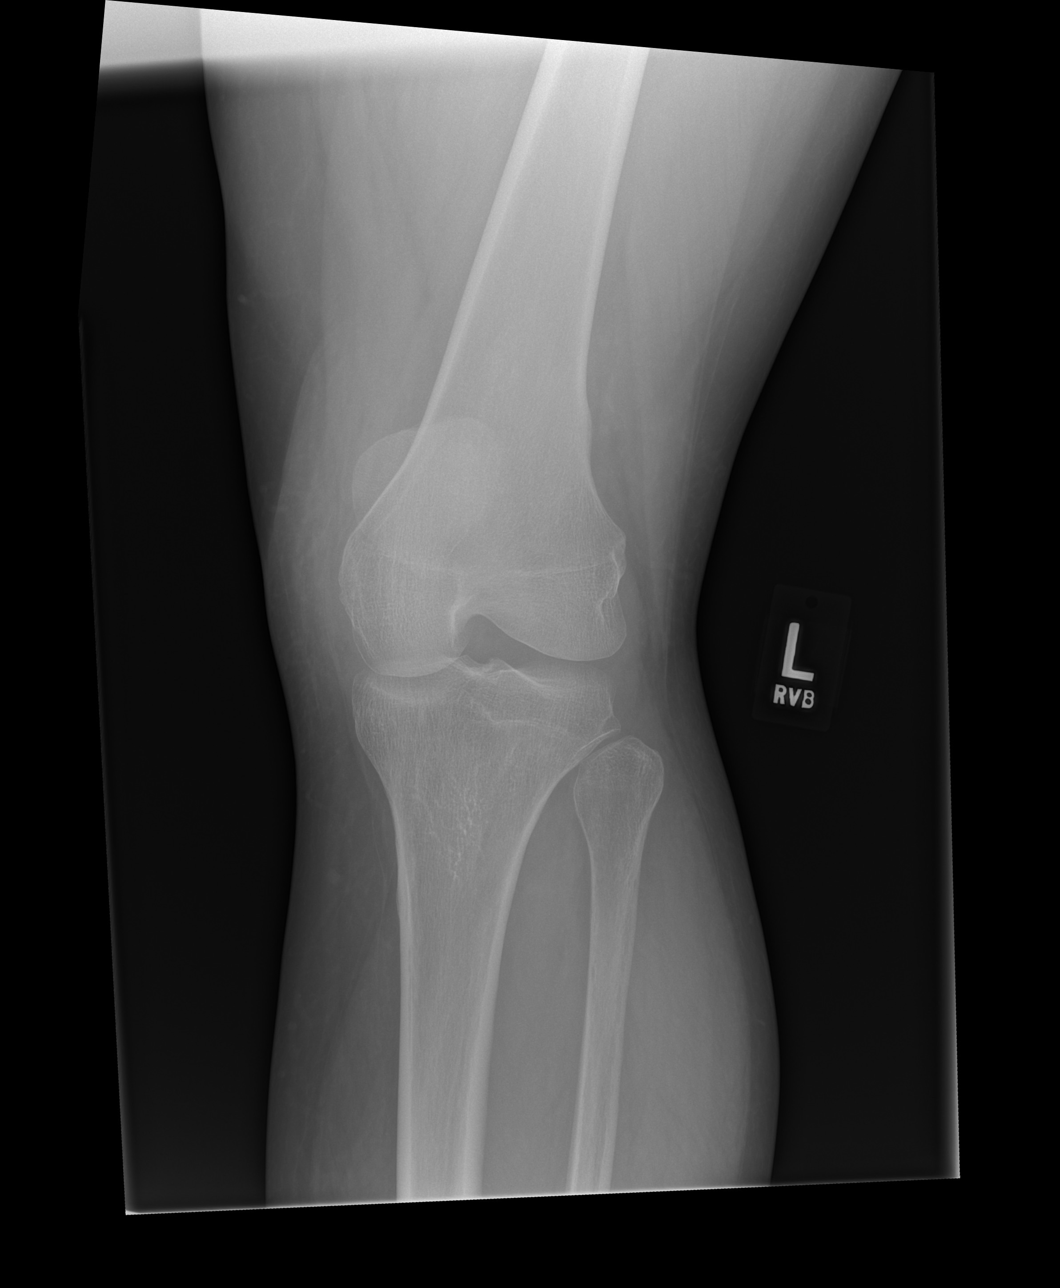

[x knee ap left (4 of 4)]
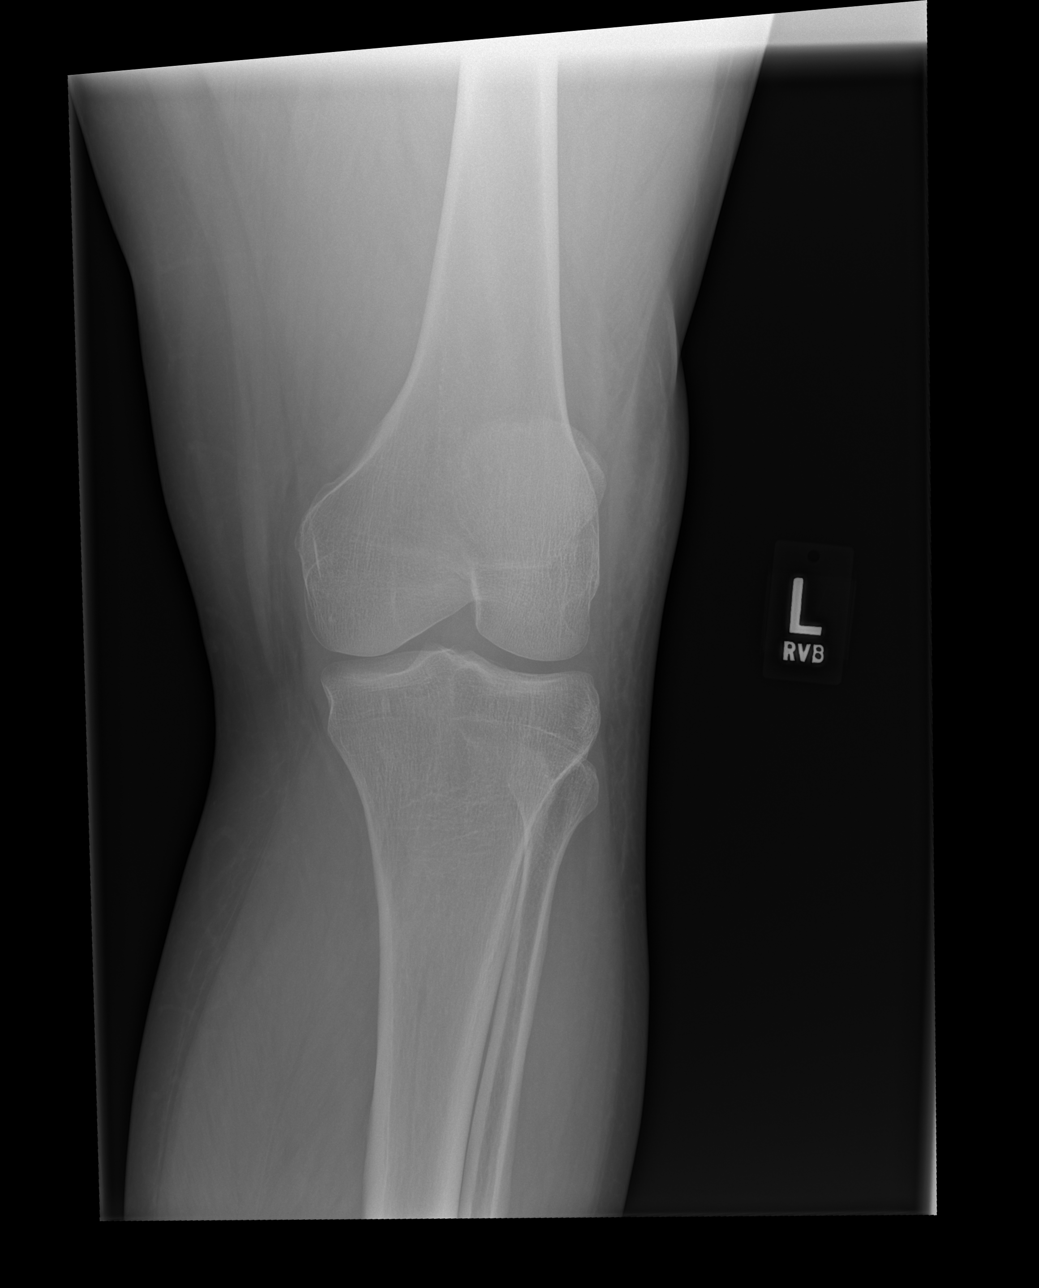

[4 of 4 positions shown; findings below may reference images not displayed]

FINDINGS: Trace suprapatellar joint effusion. No fracture or dislocation
identified. No radio-opaque foreign bodies.
IMPRESSION: 1. No acute bone abnormality.
2. Trace suprapatellar joint effusion.

## 2019-02-17 ENCOUNTER — Ambulatory Visit: Payer: Self-pay | Admitting: Podiatry

## 2019-03-02 ENCOUNTER — Encounter: Payer: Self-pay | Admitting: Podiatry

## 2019-03-02 ENCOUNTER — Other Ambulatory Visit: Payer: Self-pay | Admitting: Podiatry

## 2019-03-02 ENCOUNTER — Ambulatory Visit (INDEPENDENT_AMBULATORY_CARE_PROVIDER_SITE_OTHER): Payer: Self-pay

## 2019-03-02 ENCOUNTER — Other Ambulatory Visit: Payer: Self-pay

## 2019-03-02 ENCOUNTER — Ambulatory Visit (INDEPENDENT_AMBULATORY_CARE_PROVIDER_SITE_OTHER): Payer: Self-pay | Admitting: Podiatry

## 2019-03-02 VITALS — Temp 98.4°F

## 2019-03-02 DIAGNOSIS — M79671 Pain in right foot: Secondary | ICD-10-CM

## 2019-03-02 DIAGNOSIS — S92211G Displaced fracture of cuboid bone of right foot, subsequent encounter for fracture with delayed healing: Secondary | ICD-10-CM

## 2019-03-02 DIAGNOSIS — IMO0001 Reserved for inherently not codable concepts without codable children: Secondary | ICD-10-CM

## 2019-03-02 DIAGNOSIS — S92211K Displaced fracture of cuboid bone of right foot, subsequent encounter for fracture with nonunion: Secondary | ICD-10-CM

## 2019-03-02 DIAGNOSIS — M25471 Effusion, right ankle: Secondary | ICD-10-CM

## 2019-03-02 DIAGNOSIS — M25571 Pain in right ankle and joints of right foot: Secondary | ICD-10-CM

## 2019-03-02 DIAGNOSIS — S9304XA Dislocation of right ankle joint, initial encounter: Secondary | ICD-10-CM

## 2019-03-02 MED ORDER — TRAMADOL HCL 50 MG PO TABS: 50.0000 mg | ORAL_TABLET | Freq: Four times a day (QID) | ORAL | 0 refills | Status: DC | PRN

## 2019-03-02 NOTE — Progress Notes (Signed)
  Subjective:  Patient ID: Daniel Hanson, male    DOB: 09-Dec-1965,  MRN: [id number]  Chief Complaint  Patient presents with  . Foot Problem    Fracture follow up, pt states pain in lateral ankle.   53 y.o. male presents with the above complaint.  Was unable to have surgery previously as was not authorized by [employer].  States he is still recommend Workmen's Comp. out of his injury.  Still having significant pain at the ankle  Review of Systems: Negative except as noted in the HPI. Denies N/V/F/Ch.  Past Medical History:  Diagnosis Date  . Neck fracture (HCC) 1986    Current Outpatient Medications:  .  doxycycline (VIBRA-TABS) 100 MG tablet, TAKE 1 CAPSULE (100 MG TOTAL) BY MOUTH 2 (TWO) TIMES DAILY., Disp: , Rfl:  .  doxycycline (VIBRAMYCIN) 100 MG capsule, Take 1 capsule (100 mg total) by mouth 2 (two) times daily., Disp: 14 capsule, Rfl: 0 .  ibuprofen (ADVIL,MOTRIN) 600 MG tablet, Take 1 tablet (600 mg total) by mouth 4 (four) times daily., Disp: 30 tablet, Rfl: 0 .  oxycodone (OXY-IR) 5 MG capsule, 1 po q 4-6hrs prn pain, Disp: 40 capsule, Rfl: 0 .  oxyCODONE-acetaminophen (PERCOCET/ROXICET) 5-325 MG tablet, Take 1 tablet by mouth every 6 (six) hours as needed., Disp: 12 tablet, Rfl: 0 .  promethazine (PHENERGAN) 12.5 MG tablet, Take 1 tablet (12.5 mg total) by mouth every 6 (six) hours as needed., Disp: 12 tablet, Rfl: 0 .  sulfamethoxazole-trimethoprim (BACTRIM DS,SEPTRA DS) 800-160 MG tablet, Take 1 tablet by mouth 2 (two) times daily., Disp: 28 tablet, Rfl: 0 .  traMADol (ULTRAM) 50 MG tablet, Take 1 tablet (50 mg total) by mouth every 6 (six) hours as needed., Disp: 12 tablet, Rfl: 0  Social History   Tobacco Use  Smoking Status Current Every Day Smoker  . Packs/day: 1.00  . Types: Cigarettes  Smokeless Tobacco Never Used    No Known Allergies Objective:   Vitals:   03/02/19 1542  Temp: 98.4 F (36.9 C)   There is no height or weight on file to  calculate BMI. Constitutional Well developed. Well nourished.  Vascular Dorsalis pedis pulses palpable bilaterally. Posterior tibial pulses palpable bilaterally. Capillary refill normal to all digits.  No cyanosis or clubbing noted. Pedal hair growth normal.  Neurologic Normal speech. Oriented to person, place, and time. Epicritic sensation to light touch grossly present bilaterally.  Dermatologic Nails well groomed and normal in appearance. No open wounds. No skin lesions.  Orthopedic:  Palpation about the right cuboid joint, peroneal tendons   Radiographs: Taken and reviewed cuboid and anterior calcaneal fractures noted. Assessment:   1. 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 Subluxation of peroneal tendon of right foot, initial encounter   3. Pain and swelling of ankle, right    Plan:  Patient was evaluated and treated and all questions answered.  Cuboid Fracture with peroneal entrapment right, anterior calcaneus fracture -At this point discussed with patient he probably would benefit from excision of the cuboid fragment with peroneal repair of the subluxation and possible repair of tearing. -Rx tramadol for pain -Patient will need surgical intervention however has to look at things with Workmen's Compensation first  No follow-ups on file.

## 2019-03-03 MED ORDER — TRAMADOL HCL 50 MG PO TABS: 50.0000 mg | ORAL_TABLET | Freq: Four times a day (QID) | ORAL | 0 refills | Status: DC | PRN

## 2019-03-10 ENCOUNTER — Telehealth: Payer: Self-pay | Admitting: *Deleted

## 2019-03-10 NOTE — Telephone Encounter (Addendum)
"  I was there on the 20th.  I have a Worker's Comp case.  I need some information.  They were supposed to email my telephone with the estimate and the questions that my wife had called and asked.  They have not sent that, it's been a week.  I need that information by Monday.  That is when my litigation is for my Workman's Comp.  I need you to give me a call today.  If you have the information you can email it to my phone at [phone].  I have to have the estimate on the surgery by Monday."  (I don't have any information about this patient.  I have not received any messages from his wife.)

## 2019-03-13 NOTE — Telephone Encounter (Signed)
CPT codes sent to [facility] for quote. Can you call him first thing today?

## 2019-03-13 NOTE — Telephone Encounter (Signed)
I am returning your call.  Jocelyn Lamer in our insurance department said that she had sent you a message on Wednesday of last week.  "My phone has been acting up.  Is there any way she can make copies of it and I'll come by there to pick it up at 11 am?"  I will let her know.

## 2019-03-14 ENCOUNTER — Encounter: Payer: Self-pay | Admitting: Podiatry

## 2019-04-28 ENCOUNTER — Telehealth: Payer: Self-pay | Admitting: *Deleted

## 2019-04-28 NOTE — Telephone Encounter (Signed)
Received a call from Joycelyn Schmid at [place] [place] [facility] [place] requesting dates Dr. March Rummage would be available for a deposition.

## 2019-05-01 NOTE — Telephone Encounter (Signed)
[employer] calling to follow up on previous message. They are needing dates that Dr. March Rummage will be available for a deposition.  They stated they have been trying to get this info since the first week of October and have not been able to get any info yet.

## 2019-05-03 ENCOUNTER — Telehealth: Payer: Self-pay | Admitting: Podiatry

## 2019-05-03 NOTE — Telephone Encounter (Signed)
Cancel.

## 2019-05-10 NOTE — Telephone Encounter (Signed)
[employer] called stating they have not heard anything back from the doctor regarding dates that he is available for a deposition. The law firm has been waiting on available dates since the beginning of the month and are needing these for the patients Workers Comp case.   Please give them a call back with dates that will work for Dr. March Rummage.

## 2019-05-15 NOTE — Telephone Encounter (Signed)
Spoke with Joycelyn Schmid from [employer] regarding a deposition date for the patients workers comp claim. They are needing dates that Dr. March Rummage is available and will need notice to get everthing arranged on their side. Joycelyn Schmid is requesting a call from Dr March Rummage to discuss dates available for late November or early December.  Direct Phone # [phone]

## 2019-06-22 ENCOUNTER — Telehealth (HOSPITAL_COMMUNITY): Payer: Self-pay | Admitting: Family Medicine

## 2019-06-22 ENCOUNTER — Ambulatory Visit (HOSPITAL_COMMUNITY)
Admission: EM | Admit: 2019-06-22 | Discharge: 2019-06-22 | Disposition: A | Discharge status: Home / Self Care | Payer: Self-pay | Attending: Family Medicine | Admitting: Family Medicine

## 2019-06-22 ENCOUNTER — Encounter (HOSPITAL_COMMUNITY): Payer: Self-pay | Admitting: Emergency Medicine

## 2019-06-22 DIAGNOSIS — M25571 Pain in right ankle and joints of right foot: Secondary | ICD-10-CM | POA: Insufficient documentation

## 2019-06-22 DIAGNOSIS — I10 Essential (primary) hypertension: Secondary | ICD-10-CM | POA: Insufficient documentation

## 2019-06-22 DIAGNOSIS — R600 Localized edema: Secondary | ICD-10-CM | POA: Insufficient documentation

## 2019-06-22 LAB — COMPREHENSIVE METABOLIC PANEL
ALT: 44 U/L (ref 0–44)
AST: 38 U/L (ref 15–41)
Albumin: 4 g/dL (ref 3.5–5.0)
Alkaline Phosphatase: 97 U/L (ref 38–126)
Anion gap: 12 (ref 5–15)
BUN: 8 mg/dL (ref 6–20)
CO2: 27 mmol/L (ref 22–32)
Calcium: 8.3 mg/dL — ABNORMAL LOW (ref 8.9–10.3)
Chloride: 97 mmol/L — ABNORMAL LOW (ref 98–111)
Creatinine, Ser: 0.64 mg/dL (ref 0.61–1.24)
GFR calc Af Amer: >60 mL/min (ref >60)
GFR calc non Af Amer: >60 mL/min (ref >60)
Glucose, Bld: 102 mg/dL — ABNORMAL HIGH (ref 70–99)
Potassium: 4.4 mmol/L (ref 3.5–5.1)
Sodium: 136 mmol/L (ref 135–145)
Total Bilirubin: 0.5 mg/dL (ref 0.3–1.2)
Total Protein: 6.5 g/dL (ref 6.5–8.1)

## 2019-06-22 LAB — CBC
HCT: 47.4 % (ref 39.0–52.0)
Hemoglobin: 15.9 g/dL (ref 13.0–17.0)
MCH: 33.3 pg (ref 26.0–34.0)
MCHC: 33.5 g/dL (ref 30.0–36.0)
MCV: 99.4 fL (ref 80.0–100.0)
Platelets: 165 10*3/uL (ref 150–400)
RBC: 4.77 MIL/uL (ref 4.22–5.81)
RDW: 13.5 % (ref 11.5–15.5)
WBC: 5.3 10*3/uL (ref 4.0–10.5)
nRBC: 0 % (ref 0.0–0.2)

## 2019-06-22 MED ORDER — FUROSEMIDE 40 MG PO TABS: 40.0000 mg | ORAL_TABLET | Freq: Every day | ORAL | 0 refills | Status: DC

## 2019-06-22 MED ORDER — LISINOPRIL 10 MG PO TABS: 10.0000 mg | ORAL_TABLET | Freq: Every day | ORAL | 1 refills | Status: DC

## 2019-06-22 MED ORDER — TRAMADOL HCL 50 MG PO TABS: 50.0000 mg | ORAL_TABLET | Freq: Two times a day (BID) | ORAL | 0 refills | Status: AC | PRN

## 2019-06-22 MED ORDER — DOXYCYCLINE HYCLATE 100 MG PO CAPS: 100.0000 mg | ORAL_CAPSULE | Freq: Two times a day (BID) | ORAL | 0 refills | Status: DC

## 2019-06-22 NOTE — Telephone Encounter (Signed)
Spoke with patient and went over lab results We will send blood pressure medicine and Lasix to pharmacy as we discussed Patient understanding of all instructions and all questions answered

## 2019-06-22 NOTE — Discharge Instructions (Addendum)
I am checking some basic blood work. If this comes back looking okay I will start you on low dose BP medication.  Treated you for cellulitis based on your history and giving you a few tramadol for pain. You need to follow-up with primary care.  I have put a contact on your discharge instructions.  Please call them today to schedule an appointment. You need to elevate the legs and rest.

## 2019-06-22 NOTE — ED Provider Notes (Signed)
[facility]    CSN: [id number] Arrival date & time: 06/22/19  4098      History   Chief Complaint Chief Complaint  Patient presents with  . Cellulitis    BLE    HPI Daniel Hanson is a 53 y.o. male.   Patient is a 53 year old male with past medical history of septic prepatellar bursitis, obesity, essential hypertension, lumbago, fibula fracture who presents today with bilateral lower extremity swelling, tenderness and mild redness.  Symptoms have  been constant for the past 2 to 3 weeks.  Reporting some leaking from the legs at times.  History of cellulitis and MRSA infection.  Patient also having pain in the right foot.  Reporting multiple broken bones and awaiting surgery for this.  He has had a hard time sleeping the past couple nights due to the pain.  No associated fever, chills, nausea.  He is a current everyday smoker and uses alcohol weekly.  Blood pressure 167/100 today.  Rechecked was 153/85.  Has been on blood pressure medication in the past unsure what he was on at that time.  Denies any chest pain, shortness of breath.  No calf tenderness or history of DVTs, PE.  He does not have a primary care doctor.  Denies any bowel or bladder issues.   ROS per HPI      Past Medical History:  Diagnosis Date  . Neck fracture [facility]) 1986    Patient Active Problem List   Diagnosis Date Noted  . Septic prepatellar bursitis of left knee 10/09/2017  . Obesity 05/06/2015  . Essential hypertension 05/06/2015  . Lumbago 04/26/2015  . Fibula fracture 04/25/2015    Past Surgical History:  Procedure Laterality Date  . IRRIGATION AND DEBRIDEMENT KNEE Left 10/09/2017   Procedure: IRRIGATION AND DEBRIDEMENT KNEE;  Surgeon: Meredith Pel, MD;  Location: [facility]S;  Service: Orthopedics;  Laterality: Left;       Home Medications    Prior to Admission medications   Medication Sig Start Date End Date Taking? Authorizing Provider  doxycycline (VIBRAMYCIN) 100 MG  capsule Take 1 capsule (100 mg total) by mouth 2 (two) times daily. 06/22/19   Loura Halt A, NP  furosemide (LASIX) 40 MG tablet Take 1 tablet (40 mg total) by mouth daily. 06/22/19   Loura Halt A, NP  ibuprofen (ADVIL,MOTRIN) 600 MG tablet Take 1 tablet (600 mg total) by mouth 4 (four) times daily. 08/06/18   Lily Kocher, PA-C  lisinopril (ZESTRIL) 10 MG tablet Take 1 tablet (10 mg total) by mouth daily. 06/22/19   Loura Halt A, NP  traMADol (ULTRAM) 50 MG tablet Take 1 tablet (50 mg total) by mouth every 12 (twelve) hours as needed for up to 3 days. 06/22/19 06/25/19  Loura Halt A, NP  promethazine (PHENERGAN) 12.5 MG tablet Take 1 tablet (12.5 mg total) by mouth every 6 (six) hours as needed. 08/06/18 06/22/19  Lily Kocher, PA-C    Family History Family History  Problem Relation Age of Onset  . Stroke Father   . Parkinson's disease Father     Social History Social History   Tobacco Use  . Smoking status: Current Every Day Smoker    Packs/day: 1.00    Types: Cigarettes  . Smokeless tobacco: Never Used  Substance Use Topics  . Alcohol use: Yes    Comment: occasional  . Drug use: No     Allergies   Patient has no known allergies.   Review of Systems Review  of Systems   Physical Exam Triage Vital Signs ED Triage Vitals  Enc Vitals Group     BP 06/22/19 0838 (S) (!) 167/100     Pulse Rate 06/22/19 0838 95     Resp 06/22/19 0838 (!) 24     Temp 06/22/19 0838 98 F (36.7 C)     Temp Source 06/22/19 0838 Oral     SpO2 06/22/19 0838 98 %     Weight --      Height --      Head Circumference --      Peak Flow --      Pain Score 06/22/19 0839 9     Pain Loc --      Pain Edu? --      Excl. in GC? --    No data found.  Updated Vital Signs BP (S) (!) 167/100 (BP Location: Left Arm)   Pulse 95   Temp 98 F (36.7 C) (Oral)   Resp (!) 24   SpO2 98%   Visual Acuity Right Eye Distance:   Left Eye Distance:   Bilateral Distance:    Right Eye Near:    Left Eye Near:    Bilateral Near:     Physical Exam Vitals and nursing note reviewed.  Constitutional:      Appearance: He is obese.  HENT:     Head: Normocephalic and atraumatic.     Nose: Nose normal.     Mouth/Throat:     Pharynx: Oropharynx is clear.  Eyes:     Conjunctiva/sclera: Conjunctivae normal.  Cardiovascular:     Rate and Rhythm: Normal rate and regular rhythm.     Pulses: Normal pulses.     Heart sounds: Normal heart sounds.  Pulmonary:     Effort: Pulmonary effort is normal.     Breath sounds: Normal breath sounds.  Abdominal:     Palpations: Abdomen is soft.  Musculoskeletal:     Cervical back: Normal range of motion.     Right lower leg: Edema present.     Left lower leg: Edema present.     Comments: No calf tenderness 1+ pitting edema, LE very tight.  1+ pedal pulse.  Bilateral foot swelling, more on the right.  Tender to touch with right foot generalized.  No redness   Skin:    General: Skin is warm and dry.     Comments: Mild erythema to bilateral lower extremities. Nontender to touch.  Neurological:     General: No focal deficit present.  Psychiatric:        Mood and Affect: Mood normal.      UC Treatments / Results  Labs (all labs ordered are listed, but only abnormal results are displayed) Labs Reviewed  COMPREHENSIVE METABOLIC PANEL - Abnormal; Notable for the following components:      Result Value   Chloride 97 (*)    Glucose, Bld 102 (*)    Calcium 8.3 (*)    All other components within normal limits  CBC    EKG   Radiology No results found.  Procedures Procedures (including critical care time)  Medications Ordered in UC Medications - No data to display  Initial Impression / Assessment and Plan / UC Course  I have reviewed the triage vital signs and the nursing notes.  Pertinent labs & imaging results that were available during my care of the patient were reviewed by me and considered in my medical decision making (see  chart for details).  Bilateral lower extremity edema with 1+ pitting-mild generalized erythema without significant tenderness. Not convinced of cellulitis at this time.  Based on history we will send in doxycycline in case the pain or redness increases. No specific unilateral calf tenderness.  Not concerned for DVT today. Does have a history of MRSA but no obvious open wounds today. Pain in the right foot is chronic We will treat the bilateral lower extremity edema with Lasix x5 days Checked kidney function prior to starting this which was normal. Recommended rest, elevate the legs to help with the swelling.  Essential hypertension Patient hypertensive today at 167/100 with recheck of 153/85. Looking back blood pressures have been elevated consistently over the past year. Patient admits to unhealthy  diet of fast food with high processed and high sodium.  Patient is obese Feel it would be appropriate to go ahead and start on low-dose of blood pressure medication. Starting on lisinopril 10 mg CBC and CMP were normal  Discussed with patient healthy diet, exercise and to obtain a blood pressure cuff to monitor blood pressures.  Foot pain-this is chronic due to known multiple fractures.  Patient awaiting Worker's Comp. for surgery.  Will give tramadol for pain.  Contact given for primary care provider for follow-up And for any continued or worsening leg swelling or any new chest pain, shortness of breath he will need to go to the hospital.    Final Clinical Impressions(s) / UC Diagnoses   Final diagnoses:  Bilateral leg edema  Essential hypertension  Pain in joint involving right ankle and foot     Discharge Instructions     I am checking some basic blood work. If this comes back looking okay I will start you on low dose BP medication.  Treated you for cellulitis based on your history and giving you a few tramadol for pain. You need to follow-up with primary care.  I have put  a contact on your discharge instructions.  Please call them today to schedule an appointment. You need to elevate the legs and rest.    ED Prescriptions    Medication Sig Dispense Auth. Provider   doxycycline (VIBRAMYCIN) 100 MG capsule Take 1 capsule (100 mg total) by mouth 2 (two) times daily. 14 capsule Carmalita Wakefield A, NP   traMADol (ULTRAM) 50 MG tablet Take 1 tablet (50 mg total) by mouth every 12 (twelve) hours as needed for up to 3 days. 6 tablet Tony Friscia A, NP     I have reviewed the PDMP during this encounter.   Janace Aris, NP 06/23/19 1345

## 2019-06-22 NOTE — ED Triage Notes (Signed)
Pt c/o BLE leg pain and possible cellulitis onset 2-3 weeks  Sx include pain and swelling.  Hx of cellulitis and MRSA  A&O x4... NAD.Marland Kitchen. ambulatory

## 2019-07-20 ENCOUNTER — Other Ambulatory Visit: Payer: Self-pay

## 2019-07-20 ENCOUNTER — Ambulatory Visit: Payer: Self-pay | Admitting: Internal Medicine

## 2019-07-20 VITALS — BP 127/86 | HR 81 | Temp 99.0°F | Ht 71.0 in | Wt 299.2 lb

## 2019-07-20 DIAGNOSIS — Z6841 Body Mass Index (BMI) 40.0 and over, adult: Secondary | ICD-10-CM

## 2019-07-20 DIAGNOSIS — S92351S Displaced fracture of fifth metatarsal bone, right foot, sequela: Secondary | ICD-10-CM

## 2019-07-20 DIAGNOSIS — Z79899 Other long term (current) drug therapy: Secondary | ICD-10-CM

## 2019-07-20 DIAGNOSIS — I10 Essential (primary) hypertension: Secondary | ICD-10-CM

## 2019-07-20 DIAGNOSIS — G629 Polyneuropathy, unspecified: Secondary | ICD-10-CM | POA: Insufficient documentation

## 2019-07-20 DIAGNOSIS — R600 Localized edema: Secondary | ICD-10-CM

## 2019-07-20 DIAGNOSIS — F17210 Nicotine dependence, cigarettes, uncomplicated: Secondary | ICD-10-CM

## 2019-07-20 DIAGNOSIS — R5383 Other fatigue: Secondary | ICD-10-CM

## 2019-07-20 MED ORDER — MELOXICAM 7.5 MG PO TABS: 7.5000 mg | ORAL_TABLET | Freq: Two times a day (BID) | ORAL | 0 refills | Status: DC

## 2019-07-20 MED ORDER — FUROSEMIDE 40 MG PO TABS: 40.0000 mg | ORAL_TABLET | Freq: Every day | ORAL | 0 refills | Status: DC

## 2019-07-20 NOTE — Assessment & Plan Note (Signed)
Patient with a one-year history of progressive bilateral foot and hand pain. States that initially started out in the bottom of his feet and progressed up to his ankles. Since that time he has noticed numbness tingling in his fingertips. He describes intermittent pain. Qualifies as being stabbed with a knife. 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 He has never been evaluated for this.  On physical exam there is no focal weakness.  A/P: - Bilateral symmetric sensory polyneuropathy - Will check hemoglobin A1c and TSH.

## 2019-07-20 NOTE — Assessment & Plan Note (Signed)
Patient with well controlled HTN. He is currently on Lisinopril 10 mg QD. He is tolerating it well without any apparent side effects. He denies orthostatic symptoms. He recently had a BMP with normal renal function and potassium.   A/P: - Continue lisinopril 10 mg once daily.

## 2019-07-20 NOTE — Progress Notes (Signed)
   CC: OSA, HTN, Obesity, Foot pain  HPI:  Daniel Hanson is a 54 y.o. male with PMHx listed below presenting for OSA, HTN, Obesity, Foot pain. Please see the A&P for the status of the patient's chronic medical problems.  Past Medical History:  Diagnosis Date  . 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 Dating same woman for 20+ years. Step-daughter (works at [employer]). Smokes 15 cigarettes per day. Drinks 6 pack per week. No other illicit substance use.   Review of Systems:  Performed and all others negative.  Physical Exam: Vitals:   07/20/19 0940 07/20/19 0942  BP:  127/86  Pulse:  81  Temp:  99 F (37.2 C)  TempSrc:  Oral  SpO2:  97%  Weight: 299 lb 3.2 oz (135.7 kg)   Height: 5\' 11"  (1.803 m)    General: Morbidly obese male in no acute distress HENT: Normocephalic, atraumatic, moist mucus membranes Pulm: Good air movement with no wheezing or crackles  CV: RRR, no murmurs, no rubs  Abdomen: Active bowel sounds, soft, non-distended, no tenderness to palpation  Extremities: Pulses palpable in all extremities, mild to moderate bilateral LE edema  Skin: Warm and dry  Neuro: Alert and oriented x 3  Assessment & Plan:   See Encounters Tab for problem based charting.  Patient discussed with Dr. 

## 2019-07-20 NOTE — Progress Notes (Signed)
Internal Medicine Clinic Attending  Case discussed with Dr. Helberg at the time of the visit.  We reviewed the resident's history and exam and pertinent patient test results.  I agree with the assessment, diagnosis, and plan of care documented in the resident's note.    

## 2019-07-20 NOTE — Assessment & Plan Note (Addendum)
Patient with bilateral lower extremity edema. This is been progressive over the past year. He was recently on Lasix which has significantly helped. In the urgent care he recently had a comprehensive metabolic panel performed. I reviewed these results with normal renal function, albumin, and hepatic panel.  We discussed that I think is bilateral lower extremity edema is related to his obesity and underlying OSA.

## 2019-07-20 NOTE — Assessment & Plan Note (Signed)
Patient with morbid obesity. He states that he and his girlfriend are trying to work on weight loss together. He has been unable to exercise for the past year due to right foot pain. He has been trying to cut down on his calorie intake. He understands the weight loss given his comorbidities. We discussed lifestyle changes including the need for exercise and diet modification.

## 2019-07-20 NOTE — Assessment & Plan Note (Signed)
Patient went with concerns of excessive daytime fatigue. He states that he falls asleep easily throughout the day especially when quiet places including in front of the TV or in the car. His longtime girlfriend says that he snores very loud at night and that he occasionally quits breathing. He does have nocturia as well. He has never had a sleep study.  A/P: - I suspect he has underlying OSA. We will order split night sleep study for further evaluation.

## 2019-07-20 NOTE — Patient Instructions (Signed)
Thank you for allowing [place] to provide your care. Today we discussed the following:  1) Your trouble sleeping, fatigue, and leg swelling is likely related to untreated sleep apnea. I have ordered a sleep study to see if you would benefit from a CPAP machine.  2) For your foot pain continue to follow with Dr. Samuella Cota. I have sent out a temporary prescription for a medication called meloxicam. This can irritate your stomach so please take it with food.  3) For the pain in your hands and feet we're gonna check some blood work today and I will call you if we need to make any changes.  Please come back to see [place] in three months or sooner if any issues arise.

## 2019-07-21 LAB — TSH: TSH: 1.6 u[IU]/mL (ref 0.450–4.500)

## 2019-07-21 LAB — HEMOGLOBIN A1C
Est. average glucose Bld gHb Est-mCnc: 137 mg/dL
Hgb A1c MFr Bld: 6.4 % — ABNORMAL HIGH (ref 4.8–5.6)

## 2019-07-24 ENCOUNTER — Telehealth: Payer: Self-pay | Admitting: Internal Medicine

## 2019-07-24 NOTE — Telephone Encounter (Signed)
Attempted to call patient to discuss his labs results. No answer and voicemail is not set up. If he calls back please tell him his A1c came back in the pre-diabetic range. Needs to work on diet modification and weight loss. I will continue to try to reach the patient.   Thank you.

## 2019-08-09 ENCOUNTER — Ambulatory Visit: Payer: Self-pay

## 2019-08-11 ENCOUNTER — Other Ambulatory Visit: Payer: Self-pay | Admitting: Internal Medicine

## 2019-08-11 DIAGNOSIS — S92351S Displaced fracture of fifth metatarsal bone, right foot, sequela: Secondary | ICD-10-CM

## 2019-10-10 ENCOUNTER — Encounter (HOSPITAL_COMMUNITY): Payer: Self-pay

## 2019-10-10 ENCOUNTER — Ambulatory Visit (HOSPITAL_COMMUNITY)
Admission: EM | Admit: 2019-10-10 | Discharge: 2019-10-10 | Disposition: A | Discharge status: Home / Self Care | Payer: Self-pay | Attending: Internal Medicine | Admitting: Internal Medicine

## 2019-10-10 ENCOUNTER — Other Ambulatory Visit: Payer: Self-pay

## 2019-10-10 DIAGNOSIS — M7712 Lateral epicondylitis, left elbow: Secondary | ICD-10-CM

## 2019-10-10 MED ORDER — IBUPROFEN 600 MG PO TABS: 600.0000 mg | ORAL_TABLET | Freq: Four times a day (QID) | ORAL | 0 refills | Status: DC | PRN

## 2019-10-10 MED ORDER — KETOROLAC TROMETHAMINE 60 MG/2ML IM SOLN
INTRAMUSCULAR | Status: AC
  Filled 2019-10-10: qty 2

## 2019-10-10 MED ORDER — KETOROLAC TROMETHAMINE 60 MG/2ML IM SOLN
60.0000 mg | Freq: Once | INTRAMUSCULAR | Status: AC
  Administered 2019-10-10: 10:00:00 60 mg via INTRAMUSCULAR

## 2019-10-10 MED ORDER — FUROSEMIDE 40 MG PO TABS: 40.0000 mg | ORAL_TABLET | Freq: Every day | ORAL | 1 refills | Status: DC

## 2019-10-10 MED ORDER — LISINOPRIL 10 MG PO TABS: 10.0000 mg | ORAL_TABLET | Freq: Every day | ORAL | 2 refills | Status: DC

## 2019-10-10 MED ORDER — PREDNISONE 20 MG PO TABS: 20.0000 mg | ORAL_TABLET | Freq: Every day | ORAL | 0 refills | Status: AC

## 2019-10-10 NOTE — ED Triage Notes (Addendum)
Pt c/o left arm swelling and pain at elbow and forearm since last night. Area is red/edematous. 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 Pt request refill of lisinopril Rx.

## 2019-10-11 NOTE — ED Provider Notes (Signed)
[place]    CSN: [id number] Arrival date & time: 10/10/19  9381      History   Chief Complaint Chief Complaint  Patient presents with  . 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  Patient's requires job requires repetitive movements of the hands and elbows.  Pain is on the lateral aspect of the left ankle.  Movement and palpation of the lateral epicondyle.  No known relieving factors.  Mild erythema and swelling over the lateral epicondyle area.  No trauma to the elbow  HPI  Past Medical History:  Diagnosis Date  . Neck fracture [facility]) 1986    Patient Active Problem List   Diagnosis Date Noted  . Bilateral leg edema 07/20/2019  . Fatigue 07/20/2019  . Polyneuropathy 07/20/2019  . Obesity 05/06/2015  . Essential hypertension 05/06/2015  . Lumbago 04/26/2015  . Fibula fracture 04/25/2015    Past Surgical History:  Procedure Laterality Date  . IRRIGATION AND DEBRIDEMENT KNEE Left 10/09/2017   Procedure: IRRIGATION AND DEBRIDEMENT KNEE;  Surgeon: Meredith Pel, MD;  Location: [facility];  Service: Orthopedics;  Laterality: Left;       Home Medications    Prior to Admission medications   Medication Sig Start Date End Date Taking? Authorizing Provider  furosemide (LASIX) 40 MG tablet Take 1 tablet (40 mg total) by mouth daily. 10/10/19   LampteyMyrene Galas, MD  ibuprofen (ADVIL) 600 MG tablet Take 1 tablet (600 mg total) by mouth every 6 (six) hours as needed for moderate pain. 10/10/19   Ayla Dunigan, Myrene Galas, MD  lisinopril (ZESTRIL) 10 MG tablet Take 1 tablet (10 mg total) by mouth daily. 10/10/19   Nabilah Davoli, Myrene Galas, MD  predniSONE (DELTASONE) 20 MG tablet Take 1 tablet (20 mg total) by mouth daily for 3 days. 10/10/19 10/13/19  Chase Picket, MD  promethazine (PHENERGAN) 12.5 MG tablet Take 1 tablet (12.5 mg total) by mouth every 6 (six) hours as needed.  08/06/18 06/22/19  Lily Kocher, PA-C    Family History Family History  Problem Relation Age of Onset  . Stroke Father   . Parkinson's disease Father     Social History Social History   Tobacco Use  . Smoking status: Current Every Day Smoker    Packs/day: 1.00    Types: Cigarettes  . Smokeless tobacco: Never Used  . Tobacco comment: 5-15 cigs per day  Substance Use Topics  . Alcohol use: Yes    Comment: occasional  . Drug use: No     Allergies   Patient has no known allergies.   Review of Systems Review of Systems  Constitutional: Negative.   Gastrointestinal: Negative.   Musculoskeletal: Positive for arthralgias, joint swelling and myalgias. Negative for back pain and gait problem.  Skin: Positive for color change. Negative for rash and wound.  Neurological: Negative for dizziness, light-headedness and headaches.     Physical Exam Triage Vital Signs ED Triage Vitals  Enc Vitals Group     BP 10/10/19 0928 (!) 146/98     Pulse Rate 10/10/19 0928 (!) 103     Resp 10/10/19 0928 20     Temp 10/10/19 0928 98 F (36.7 C)     Temp Source 10/10/19 0928 Oral     SpO2 10/10/19 0928 96 %     Weight --  Height --      Head Circumference --      Peak Flow --      Pain Score 10/10/19 0925 8     Pain Loc --      Pain Edu? --      Excl. in GC? --    No data found.  Updated Vital Signs BP (!) 146/98 (BP Location: Right Arm)   Pulse (!) 103   Temp 98 F (36.7 C) (Oral)   Resp 20   SpO2 96%   Visual Acuity Right Eye Distance:   Left Eye Distance:   Bilateral Distance:    Right Eye Near:   Left Eye Near:    Bilateral Near:     Physical Exam Cardiovascular:     Rate and Rhythm: Normal rate and regular rhythm.  Pulmonary:     Effort: Pulmonary effort is normal.     Breath sounds: Normal breath sounds.  Abdominal:     General: Bowel sounds are normal.     Palpations: Abdomen is soft.  Musculoskeletal:        General: Swelling and tenderness  present. Normal range of motion.     Right lower leg: No edema.     Left lower leg: No edema.     Comments: Tenderness over the lateral epicondyle with mild tenderness and swelling.  Skin:    Capillary Refill: Capillary refill takes less than 2 seconds.  Neurological:     General: No focal deficit present.     Mental Status: He is alert and oriented to person, place, and time.      UC Treatments / Results  Labs (all labs ordered are listed, but only abnormal results are displayed) Labs Reviewed - No data to display  EKG   Radiology No results found.  Procedures Procedures (including critical care time)  Medications Ordered in UC Medications  ketorolac (TORADOL) injection 60 mg (60 mg Intramuscular Given 10/10/19 0951)    Initial Impression / Assessment and Plan / UC Course  I have reviewed the triage vital signs and the nursing notes.  Pertinent labs & imaging results that were available during my care of the patient were reviewed by me and considered in my medical decision making (see chart for details).     1.  Left lateral epicondylitis: Ibuprofen every 6 hours as needed pain Short course of steroids Elbow sleeve Return precautions given No indication for x-rays  2.  High blood pressure, uncontrolled: Refill medication Patient denies chest pain or chest pressure, abdominal pain Medication compliance emphasized. Final Clinical Impressions(s) / UC Diagnoses   Final diagnoses:  Epicondylitis, lateral, left   Discharge Instructions   None    ED Prescriptions    Medication Sig Dispense Auth. Provider   furosemide (LASIX) 40 MG tablet Take 1 tablet (40 mg total) by mouth daily. 90 tablet Johnni Wunschel, Britta Mccreedy, MD   ibuprofen (ADVIL) 600 MG tablet  (Status: Discontinued) Take 1 tablet (600 mg total) by mouth every 6 (six) hours as needed for moderate pain. 30 tablet Mick Tanguma, Britta Mccreedy, MD   lisinopril (ZESTRIL) 10 MG tablet Take 1 tablet (10 mg total) by mouth  daily. 30 tablet Destenee Guerry, Britta Mccreedy, MD   predniSONE (DELTASONE) 20 MG tablet Take 1 tablet (20 mg total) by mouth daily for 3 days. 3 tablet Breanna Mcdaniel, Britta Mccreedy, MD   ibuprofen (ADVIL) 600 MG tablet Take 1 tablet (600 mg total) by mouth every 6 (six) hours as needed for moderate pain.  30 tablet Elissia Spiewak, Britta Mccreedy, MD     PDMP not reviewed this encounter.   Merrilee Jansky, MD 10/11/19 [phone]

## 2019-11-16 ENCOUNTER — Encounter: Payer: Self-pay | Admitting: Podiatry

## 2019-11-16 ENCOUNTER — Ambulatory Visit: Payer: Self-pay

## 2019-11-16 ENCOUNTER — Other Ambulatory Visit: Payer: Self-pay | Admitting: Podiatry

## 2019-11-16 ENCOUNTER — Ambulatory Visit (INDEPENDENT_AMBULATORY_CARE_PROVIDER_SITE_OTHER): Payer: Self-pay

## 2019-11-16 ENCOUNTER — Other Ambulatory Visit: Payer: Self-pay

## 2019-11-16 ENCOUNTER — Ambulatory Visit (INDEPENDENT_AMBULATORY_CARE_PROVIDER_SITE_OTHER): Payer: Worker's Compensation | Admitting: Podiatry

## 2019-11-16 DIAGNOSIS — M79672 Pain in left foot: Secondary | ICD-10-CM | POA: Diagnosis not present

## 2019-11-16 DIAGNOSIS — S9304XA Dislocation of right ankle joint, initial encounter: Secondary | ICD-10-CM

## 2019-11-16 DIAGNOSIS — S92211K Displaced fracture of cuboid bone of right foot, subsequent encounter for fracture with nonunion: Secondary | ICD-10-CM

## 2019-11-16 DIAGNOSIS — M722 Plantar fascial fibromatosis: Secondary | ICD-10-CM

## 2019-11-16 DIAGNOSIS — IMO0001 Reserved for inherently not codable concepts without codable children: Secondary | ICD-10-CM

## 2019-11-16 NOTE — Progress Notes (Signed)
  Subjective:  Patient ID: Daniel Hanson, male    DOB: 1965-11-06,  MRN: [id number]  Chief Complaint  Patient presents with  . Foot Pain    Left plantar heel pain 6 month duration, pt states may be due to change in gait from right foot pain.  . Foot Injury    Right foot fracture follow up. Pt states it has never gotten any better only worse.    54 y.o. male presents with the above complaint. History confirmed with patient.   Objective:  Physical Exam: warm, good capillary refill, no trophic changes or ulcerative lesions, normal DP and PT pulses and normal sensory exam. Left Foot: Pain palpation about the left medial calcaneal heel tuber Right Foot: Pain palpation about the cuboid, pain palpation about the lateral ankle about the peroneals, pain palpation about the lateral ankle gutter  No images are attached to the encounter.  Radiographs: X-ray of the right foot/ankle: Evidence of prior cuboid fracture, heel spurring Assessment:   1. 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 Subluxation of peroneal tendon of right foot, initial encounter   3. Pain in left foot   4. Plantar fasciitis, left      Plan:  Patient was evaluated and treated and all questions answered.  Cuboid Fracture R, Peroneal Tendonitiis -Order MRI for pre-op planning. CT 1 year ago. MRI preferred for better eval of the tendon. -Plan for surgery after MRI.  Plantar Fasciitis -XR reviewed with patient -Injection delivered to the plantar fascia of the left foot.  Procedure: Injection Tendon/Ligament Consent: Verbal consent obtained. Location: Left plantar fascia at the glabrous junction; medial approach. Skin Prep: Alcohol. Injectate: 1 cc 0.5% marcaine plain, 1 cc dexamethasone phosphate, 0.5 cc kenalog 10. Disposition: Patient tolerated procedure well. Injection site dressed with a band-aid.  Return in about 2 weeks (around 11/30/2019) for MRI F/u.

## 2019-11-17 ENCOUNTER — Telehealth: Payer: Self-pay | Admitting: *Deleted

## 2019-11-17 DIAGNOSIS — S92211K Displaced fracture of cuboid bone of right foot, subsequent encounter for fracture with nonunion: Secondary | ICD-10-CM

## 2019-11-17 DIAGNOSIS — S9304XA Dislocation of right ankle joint, initial encounter: Secondary | ICD-10-CM

## 2019-11-17 DIAGNOSIS — IMO0001 Reserved for inherently not codable concepts without codable children: Secondary | ICD-10-CM

## 2019-11-17 DIAGNOSIS — M25471 Effusion, right ankle: Secondary | ICD-10-CM

## 2019-11-17 NOTE — Telephone Encounter (Signed)
Faxed orders to [facility] and given to Everlena Cooper, CMA for pre-cert.

## 2019-11-17 NOTE — Telephone Encounter (Signed)
-----   Message from Park Liter, DPM sent at 11/16/2019  4:42 PM EDT ----- Can we order MRI R ankle. Dx: cuboid fracture right, peroneal entrapment.

## 2019-11-18 ENCOUNTER — Ambulatory Visit
Admission: RE | Admit: 2019-11-18 | Discharge: 2019-11-18 | Disposition: A | Discharge status: Home / Self Care | Payer: Self-pay | Source: Ambulatory Visit | Attending: Podiatry | Admitting: Podiatry

## 2019-11-18 ENCOUNTER — Other Ambulatory Visit: Payer: Self-pay

## 2019-11-18 DIAGNOSIS — IMO0001 Reserved for inherently not codable concepts without codable children: Secondary | ICD-10-CM

## 2019-11-18 DIAGNOSIS — S9304XA Dislocation of right ankle joint, initial encounter: Secondary | ICD-10-CM

## 2019-11-18 DIAGNOSIS — S92211K Displaced fracture of cuboid bone of right foot, subsequent encounter for fracture with nonunion: Secondary | ICD-10-CM

## 2019-11-18 DIAGNOSIS — M25471 Effusion, right ankle: Secondary | ICD-10-CM

## 2019-12-01 ENCOUNTER — Other Ambulatory Visit: Payer: Self-pay

## 2019-12-01 ENCOUNTER — Ambulatory Visit (INDEPENDENT_AMBULATORY_CARE_PROVIDER_SITE_OTHER): Payer: Self-pay | Admitting: Podiatry

## 2019-12-01 DIAGNOSIS — IMO0001 Reserved for inherently not codable concepts without codable children: Secondary | ICD-10-CM

## 2019-12-01 DIAGNOSIS — M958 Other specified acquired deformities of musculoskeletal system: Secondary | ICD-10-CM

## 2019-12-01 DIAGNOSIS — M25471 Effusion, right ankle: Secondary | ICD-10-CM

## 2019-12-01 DIAGNOSIS — M25571 Pain in right ankle and joints of right foot: Secondary | ICD-10-CM

## 2019-12-01 DIAGNOSIS — S9304XA Dislocation of right ankle joint, initial encounter: Secondary | ICD-10-CM

## 2019-12-01 DIAGNOSIS — S92211K Displaced fracture of cuboid bone of right foot, subsequent encounter for fracture with nonunion: Secondary | ICD-10-CM

## 2019-12-01 DIAGNOSIS — M722 Plantar fascial fibromatosis: Secondary | ICD-10-CM

## 2019-12-01 NOTE — Patient Instructions (Signed)
Pre-Operative Instructions  Congratulations, you have decided to take an important step towards improving your quality of life.  You can be assured that the doctors and staff at [facility] will be with you every step of the way.  Here are some important things you should know:  1. Plan to be at the surgery center/hospital at least 1 (one) hour prior to your scheduled time, unless otherwise directed by the surgical center/hospital staff.  You must have a responsible adult accompany you, remain during the surgery and drive you home.  Make sure you have directions to the surgical center/hospital to ensure you arrive on time. 2. If you are having surgery at [facility] or [facility] hospitals, you will need a copy of your medical history and physical form from your family physician within one month prior to the date of surgery. We will give you a form for your primary physician to complete.  3. We make every effort to accommodate the date you request for surgery.  However, there are times where surgery dates or times have to be moved.  We will contact you as soon as possible if a change in schedule is required.   4. No aspirin/ibuprofen for one week before surgery.  If you are on aspirin, any non-steroidal anti-inflammatory medications (Mobic, Aleve, Ibuprofen) should not be taken seven (7) days prior to your surgery.  You make take Tylenol for pain prior to surgery.  5. Medications - If you are taking daily heart and blood pressure medications, seizure, reflux, allergy, asthma, anxiety, pain or diabetes medications, make sure you notify the surgery center/hospital before the day of surgery so they can tell you which medications you should take or avoid the day of surgery. 6. No food or drink after midnight the night before surgery unless directed otherwise by surgical center/hospital staff. 7. No alcoholic beverages 24-hours prior to surgery.  No smoking 24-hours prior or 24-hours after  surgery. 8. Wear loose pants or shorts. They should be loose enough to fit over bandages, boots, and casts. 9. Don't wear slip-on shoes. Sneakers are preferred. 10. Bring your boot with you to the surgery center/hospital.  Also bring crutches or a walker if your physician has prescribed it for you.  If you do not have this equipment, it will be provided for you after surgery. 11. If you have not been contacted by the surgery center/hospital by the day before your surgery, call to confirm the date and time of your surgery. 12. Leave-time from work may vary depending on the type of surgery you have.  Appropriate arrangements should be made prior to surgery with your employer. 13. Prescriptions will be provided immediately following surgery by your doctor.  Fill these as soon as possible after surgery and take the medication as directed. Pain medications will not be refilled on weekends and must be approved by the doctor. 14. Remove nail polish on the operative foot and avoid getting pedicures prior to surgery. 15. Wash the night before surgery.  The night before surgery wash the foot and leg well with water and the antibacterial soap provided. Be sure to pay special attention to beneath the toenails and in between the toes.  Wash for at least three (3) minutes. Rinse thoroughly with water and dry well with a towel.  Perform this wash unless told not to do so by your physician.  Enclosed: 1 Ice pack (please put in freezer the night before surgery)   1 Hibiclens skin cleaner     Pre-op instructions  If you have any questions regarding the instructions, please do not hesitate to call our office.  [place]: [address], [place], [place] [address] -- [phone]  [place]: [address] [place],  [address] -- [phone]  [place]: [address], [place],  [address] -- [phone]   Website: [email or web address] 

## 2019-12-03 NOTE — Progress Notes (Signed)
  Subjective:  Patient ID: Daniel Hanson, male    DOB: [birth year],  MRN: [id number]  Chief Complaint  Patient presents with  . Foot Injury    Pt states still sore. MRI review. No new concerns.    54 y.o. male presents with the above complaint. History confirmed with patient.   Objective:  Physical Exam: warm, good capillary refill, no trophic changes or ulcerative lesions, normal DP and PT pulses and normal sensory exam. Right Foot: Pain palpation at the medial ankle joint, pain about the medial calcaneal tuber, pain at the cuboid and surrounding peroneal tendons  MRI: 11/18/19 IMPRESSION: 1. Chronic deformity of the cuboid, with a 1.6 cm nonunited fragment from the proximal inferior cuboid, with some edema along the pseudoarticulation between this fragment and the rest of the cuboid. The peroneus longus passes adjacent to this fragment, without current entrapment. 2. Small fragments along the anterior process of the calcaneus. A prior bony fragment from the navicular inferiorly is probably partially fused in to the navicular on image 13/6. 3. 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 4. Thickened medial and land lateral band of the plantar fascia proximally, suspicious for plantar fasciitis. 5. 6 mm non-fragmented osteochondral lesion of the medial talar dome. 6. Moderate subcutaneous edema circumferentially in the ankle. 7. Despite efforts by the technologist and patient, severe motion artifact is present on today's exam and could not be eliminated. This reduces exam sensitivity and specificity. Assessment:   1. 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 Subluxation of peroneal tendon of right foot, initial encounter   3. Pain and swelling of ankle, right   4. Plantar fasciitis   5. Osteochondral defect of ankle    Plan:  Patient was evaluated and treated and all questions answered.  Cuboid  Fracture R, Peroneal Tendonitiis; Ankle OCD -MRI reviewed with patient -Consent reviewed for surgery for the above findings. -Patient has failed all conservative therapy and wishes to proceed with surgical intervention. All risks, benefits, and alternatives discussed with patient. No guarantees given. Consent reviewed and signed by patient. -Planned procedures: Right cuboid fracture ORIF versus excision of fragment, possible peroneal tendon repair, plantar fasciotomy, ankle arthroscopy.  Return for post-op care.

## 2019-12-08 ENCOUNTER — Telehealth: Payer: Self-pay

## 2019-12-08 NOTE — Telephone Encounter (Signed)
DOS 12/13/19  OPEN TREATMENT OF TARSAL BONE FX RT - 60630 ARTHROSCOPY, ANKLE EXCISION OCD RT - 29891 EPF RT - 29893 REPAIR POST TIBIAL TENDON SECONDARY - 27659 HEEL SPUR RESECTION RT - 28119 INJECTION PATELET RICH PLASMA RT - 0232T  RECEIVED EMAIL FROM [name] RODGERS W/ACCIDENT FUND  ON 12/08/2019 APPROVING SURGERY. WILL SEND COPY OF EMAIL TO BE SCANNED IN THE PATIENTS CHART.

## 2019-12-13 ENCOUNTER — Encounter: Payer: Self-pay | Admitting: Podiatry

## 2019-12-13 ENCOUNTER — Other Ambulatory Visit: Payer: Self-pay | Admitting: Podiatry

## 2019-12-13 DIAGNOSIS — S9304XA Dislocation of right ankle joint, initial encounter: Secondary | ICD-10-CM

## 2019-12-13 DIAGNOSIS — M722 Plantar fascial fibromatosis: Secondary | ICD-10-CM

## 2019-12-13 DIAGNOSIS — S92211K Displaced fracture of cuboid bone of right foot, subsequent encounter for fracture with nonunion: Secondary | ICD-10-CM

## 2019-12-13 MED ORDER — CEPHALEXIN 500 MG PO CAPS: 500.0000 mg | ORAL_CAPSULE | Freq: Two times a day (BID) | ORAL | 0 refills | Status: DC

## 2019-12-13 MED ORDER — OXYCODONE-ACETAMINOPHEN 10-325 MG PO TABS: 1.0000 | ORAL_TABLET | ORAL | 0 refills | Status: DC | PRN

## 2019-12-13 NOTE — Progress Notes (Signed)
Rx sent to pharmacy for outpatient surgery. °

## 2019-12-14 ENCOUNTER — Other Ambulatory Visit: Payer: Self-pay

## 2019-12-14 ENCOUNTER — Telehealth: Payer: Self-pay | Admitting: *Deleted

## 2019-12-14 ENCOUNTER — Ambulatory Visit (INDEPENDENT_AMBULATORY_CARE_PROVIDER_SITE_OTHER): Payer: Self-pay | Admitting: Podiatry

## 2019-12-14 DIAGNOSIS — Z9889 Other specified postprocedural states: Secondary | ICD-10-CM

## 2019-12-14 NOTE — Telephone Encounter (Signed)
I spoke with Daniel Hanson and asked if the blood was dried and she stated no. I asked if it was larger than a 50cent piece and she stated she would ask Daniel Hanson. I spoke wit Daniel Hanson and he states the area is greater than a 50cent piece and is still wet. I told them to come in for evaluation at 1:00pm.

## 2019-12-14 NOTE — Telephone Encounter (Signed)
Daniel Hanson states pt had surgery yesterday and the ankle is bleeding.

## 2019-12-18 ENCOUNTER — Telehealth: Payer: Self-pay | Admitting: *Deleted

## 2019-12-18 MED ORDER — OXYCODONE-ACETAMINOPHEN 10-325 MG PO TABS: 1.0000 | ORAL_TABLET | ORAL | 0 refills | Status: DC | PRN

## 2019-12-18 MED ORDER — IBUPROFEN 600 MG PO TABS: 600.0000 mg | ORAL_TABLET | Freq: Four times a day (QID) | ORAL | 0 refills | Status: DC | PRN

## 2019-12-18 NOTE — Telephone Encounter (Signed)
Pt called requested pain medication refill and Ibuprofen 600mg .

## 2019-12-19 ENCOUNTER — Telehealth: Payer: Self-pay | Admitting: Podiatry

## 2019-12-19 ENCOUNTER — Other Ambulatory Visit: Payer: Self-pay

## 2019-12-19 ENCOUNTER — Ambulatory Visit (INDEPENDENT_AMBULATORY_CARE_PROVIDER_SITE_OTHER): Payer: Self-pay | Admitting: Podiatry

## 2019-12-19 DIAGNOSIS — Z9889 Other specified postprocedural states: Secondary | ICD-10-CM

## 2019-12-19 NOTE — Telephone Encounter (Signed)
Dos 12/13/19 pt wife called stating that pt took boot off and the pts bandages have a yellowish color and states that they are wet no bleeding and has a odor and states that its sore. Pt also wanted a refill of his pain medication please advise

## 2019-12-19 NOTE — Telephone Encounter (Signed)
I told pt's wife, Misty Stanley pt needed an appt today and I would call with appt. Pt is scheduled to see me at 3:30pm today.

## 2019-12-19 NOTE — Telephone Encounter (Signed)
Pt presents to office for evaluation of surgery foot with odor and damp dressing. Pt is wearing the cam boot and states the foot smells bad. I removed the boot, the 3 ace wraps and all of the initial surgery dressing due to it being rolled up around pt's dorsal foot and plantar heel. The lateral suture line at the ankle was macerated and I covered with betadine and 2x2 gauze and wrapped with gauze and 1 ace wrap. The drainage on the dressing was old dried blood and had an odor, there was no increased redness, or swellling and not new drainage. The foot and toes had quick capillary refill and both feet were the same temperature. I told pt to continue to rest, and elevate, that too much activity would cause the dressing to roll up and the foot to sweat due to the heavy dressing and boot, also causing the smell.

## 2019-12-19 NOTE — Telephone Encounter (Signed)
Thank you so much

## 2019-12-21 ENCOUNTER — Ambulatory Visit (INDEPENDENT_AMBULATORY_CARE_PROVIDER_SITE_OTHER): Payer: Self-pay | Admitting: Podiatry

## 2019-12-21 ENCOUNTER — Other Ambulatory Visit: Payer: Self-pay

## 2019-12-21 DIAGNOSIS — R600 Localized edema: Secondary | ICD-10-CM

## 2019-12-21 MED ORDER — OXYCODONE-ACETAMINOPHEN 10-325 MG PO TABS: 1.0000 | ORAL_TABLET | ORAL | 0 refills | Status: DC | PRN

## 2019-12-21 NOTE — Progress Notes (Signed)
  Subjective:  Patient ID: Daniel Hanson, male    DOB: 07-03-1966,  MRN: [id number]  No chief complaint on file.   DOS: 12/13/19 Procedure: Rt foot ankle arthroscopy with microfracture of OCD, EPF, Repair of cuboid non-union with excision of bone.  54 y.o. male presents with the above complaint. History confirmed with patient. Patient is doing very well post-operatively. States that he has experienced significant and immediate reduction in pain.  Pleased with results of the surgery so far.  Objective:  Physical Exam: tenderness at the surgical site, local edema noted and calf supple, nontender.  All nerve distributions intact Incision: healing well, no significant drainage, no dehiscence, no significant erythema  Assessment:   1. Post-operative state     Plan:  Patient was evaluated and treated and all questions answered.  Post-operative State -Staples removed from the anterior ankle portals -Dressing applied consisting of Xeroform, Unna boot, cast padding, Ace bandage -WBAT in CAM boot -Pain medication refilled -Follow-up 1 week for wound check  No follow-ups on file.

## 2019-12-28 ENCOUNTER — Telehealth: Payer: Self-pay | Admitting: *Deleted

## 2019-12-28 ENCOUNTER — Ambulatory Visit (INDEPENDENT_AMBULATORY_CARE_PROVIDER_SITE_OTHER): Payer: Self-pay | Admitting: Podiatry

## 2019-12-28 ENCOUNTER — Other Ambulatory Visit: Payer: Self-pay

## 2019-12-28 VITALS — Temp 98.6°F

## 2019-12-28 DIAGNOSIS — R600 Localized edema: Secondary | ICD-10-CM

## 2019-12-28 MED ORDER — OXYCODONE-ACETAMINOPHEN 10-325 MG PO TABS: 1.0000 | ORAL_TABLET | ORAL | 0 refills | Status: DC | PRN

## 2019-12-28 NOTE — Telephone Encounter (Signed)
I spoke with Daniel Hanson and informed of the prescription being ready for pick up without co-pay.

## 2019-12-28 NOTE — Telephone Encounter (Signed)
I spoke with Adrienna - [facility] [phone] and she states the Workers' Comp went through there will be no co-pay.

## 2019-12-28 NOTE — Telephone Encounter (Signed)
Pt called states he is having trouble with the pharmacy getting the medication filled.

## 2019-12-28 NOTE — Progress Notes (Addendum)
  Subjective:  Patient ID: Daniel Hanson, male    DOB: [birth year],  MRN: [id number]  Chief Complaint  Patient presents with  . Routine Post Op    POV #2 DOS 12/13/2019 RT FOOT OPEN REDUCTION INTERNAL FIXATION/REPAIR NON-UNION OF CUBOID FX VS EXCISION OF BROKEN BONE, POSS PERONEAL TENDON REPAIR, ANKLE ARTHROSCOPY W/DRILLING, EPF RT, POSS HEEL SPUR RES    DOS: 12/13/19 Procedure: Rt foot ankle arthroscopy with microfracture of OCD, EPF, Repair of cuboid non-union with excision of bone.  54 y.o. male presents with the above complaint. History confirmed with patient. Patient is doing very well post-operatively. Pain controlled with pain medication. Objective:  Physical Exam: tenderness at the surgical site, local edema noted and calf supple, nontender.  All nerve distributions intact Incision: healing well, no significant drainage, no dehiscence, no significant erythema. Maceration large lateral incision.   Assessment:   1. Localized edema     Plan:  Patient was evaluated and treated and all questions answered.  Post-operative State -Staples removed from EPF sites. Left intact lateral -Dressing applied consisting of Xeroform, Unna boot, cast padding, Ace bandage -WBAT in CAM boot -Pain medication refilled -Follow-up 1 week for wound check   Return in about 1 week (around 01/04/2020) for Post-Op (No XRs) - staple removal.

## 2020-01-05 ENCOUNTER — Ambulatory Visit (INDEPENDENT_AMBULATORY_CARE_PROVIDER_SITE_OTHER): Payer: Self-pay | Admitting: Podiatry

## 2020-01-05 ENCOUNTER — Other Ambulatory Visit: Payer: Self-pay

## 2020-01-05 ENCOUNTER — Encounter: Payer: Self-pay | Admitting: Podiatry

## 2020-01-05 DIAGNOSIS — S92211K Displaced fracture of cuboid bone of right foot, subsequent encounter for fracture with nonunion: Secondary | ICD-10-CM

## 2020-01-05 DIAGNOSIS — M722 Plantar fascial fibromatosis: Secondary | ICD-10-CM

## 2020-01-05 DIAGNOSIS — S9304XA Dislocation of right ankle joint, initial encounter: Secondary | ICD-10-CM

## 2020-01-05 DIAGNOSIS — IMO0001 Reserved for inherently not codable concepts without codable children: Secondary | ICD-10-CM

## 2020-01-05 DIAGNOSIS — Z9889 Other specified postprocedural states: Secondary | ICD-10-CM

## 2020-01-05 MED ORDER — OXYCODONE-ACETAMINOPHEN 10-325 MG PO TABS: 1.0000 | ORAL_TABLET | Freq: Three times a day (TID) | ORAL | 0 refills | Status: AC | PRN

## 2020-01-05 NOTE — Progress Notes (Signed)
Subjective:  Patient ID: Daniel Hanson, male    DOB: [birth year],  MRN: [id number]  Chief Complaint  Patient presents with  . Routine Post Op    POV #2 DOS 12/13/2019 RT FOOT OPEN REDUCTION INTERNAL FIXATION/REPAIR NON-UNION OF CUBOID FX VS EXCISION OF BROKEN BONE, POSS PERONEAL TENDON REPAIR, ANKLE ARTHROSCOPY W/DRILLING, EPF RT, POSS HEEL SPUR RES      54 y.o. male returns for post-op check.  Patient is doing really well.  He denies any pain.  Patient states that he has been taking his pain medication which tends to help him.  He would like another course of pain medication which does tend to help him with pain.  He denies any other acute complaints.  He has been staying off of it he would like to know if the rest of the staples can come out.  Review of Systems: Negative except as noted in the HPI. Denies N/V/F/Ch.  Past Medical History:  Diagnosis Date  . Neck fracture ([facility]) 1986    Current Outpatient Medications:  .  cephALEXin (KEFLEX) 500 MG capsule, Take 1 capsule (500 mg total) by mouth 2 (two) times daily., Disp: 14 capsule, Rfl: 0 .  furosemide (LASIX) 40 MG tablet, Take 1 tablet (40 mg total) by mouth daily., Disp: 90 tablet, Rfl: 1 .  ibuprofen (ADVIL) 600 MG tablet, Take 1 tablet (600 mg total) by mouth every 6 (six) hours as needed for moderate pain., Disp: 30 tablet, Rfl: 0 .  lisinopril (ZESTRIL) 10 MG tablet, Take 1 tablet (10 mg total) by mouth daily., Disp: 30 tablet, Rfl: 2 .  oxyCODONE-acetaminophen (PERCOCET) 10-325 MG tablet, Take 1 tablet by mouth every 4 (four) hours as needed for pain., Disp: 20 tablet, Rfl: 0 .  oxyCODONE-acetaminophen (PERCOCET) 10-325 MG tablet, Take 1 tablet by mouth every 8 (eight) hours as needed for up to 5 days for pain., Disp: 15 tablet, Rfl: 0  Social History   Tobacco Use  Smoking Status Current Every Day Smoker  . Packs/day: 1.00  . Types: Cigarettes  Smokeless Tobacco Never Used  Tobacco Comment   5-15 cigs per day     No Known Allergies Objective:  There were no vitals filed for this visit. There is no height or weight on file to calculate BMI. Constitutional Well developed. Well nourished.  Vascular Foot warm and well perfused. Capillary refill normal to all digits.   Neurologic Normal speech. Oriented to person, place, and time. Epicritic sensation to light touch grossly present bilaterally.  Dermatologic Skin healing well without signs of infection.  Mild dehiscence noted to the right lateral foot superficial in nature.  No clinical signs of infection without signs of infection.  Staples are intact.  Orthopedic: Tenderness to palpation noted about the surgical site.   Radiographs: None Assessment:   1. 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 Plantar fasciitis   3. Subluxation of peroneal tendon of right foot, initial encounter   4. Status post foot surgery    Plan:  Patient was evaluated and treated and all questions answered.  S/p foot surgery right -Progressing as expected post-operatively. -XR: None -WB Status: Nonweightbearing to the right lower extremity  -Sutures: Staples were removed from the right lateral incision.  Mild dehiscence superficial in nature noted.  No clinical signs of infection noted. -Medications: Percocet was dispensed. -Foot redressed. -We will return to see Dr. Samuella Cota again in 2 weeks.  No follow-ups on file.

## 2020-01-11 ENCOUNTER — Other Ambulatory Visit: Payer: Self-pay

## 2020-01-11 ENCOUNTER — Ambulatory Visit (INDEPENDENT_AMBULATORY_CARE_PROVIDER_SITE_OTHER): Payer: Self-pay | Admitting: Podiatry

## 2020-01-11 DIAGNOSIS — S92211K Displaced fracture of cuboid bone of right foot, subsequent encounter for fracture with nonunion: Secondary | ICD-10-CM

## 2020-01-11 DIAGNOSIS — IMO0001 Reserved for inherently not codable concepts without codable children: Secondary | ICD-10-CM

## 2020-01-11 DIAGNOSIS — S9304XD Dislocation of right ankle joint, subsequent encounter: Secondary | ICD-10-CM

## 2020-01-11 MED ORDER — OXYCODONE-ACETAMINOPHEN 10-325 MG PO TABS: 1.0000 | ORAL_TABLET | ORAL | 0 refills | Status: DC | PRN

## 2020-01-11 NOTE — Progress Notes (Signed)
  Subjective:  Patient ID: Daniel Hanson, male    DOB: 07-Mar-1966,  MRN: [id number]  Chief Complaint  Patient presents with  . Routine Post Op    Pt states healing well without any concerns. Denies fever/chills/nausea/vomiting. Pt states he is still sore and having pain, needs pain medication refill.    DOS: 12/13/19 Procedure: Rt foot ankle arthroscopy with microfracture of OCD, EPF, Repair of cuboid non-union with excision of bone.  54 y.o. male presents with the above complaint. History confirmed with patient. Having a bit of soreness with ROM but otherwise doing well Objective:  Physical Exam: tenderness at the surgical site, local edema noted and calf supple, nontender.  All nerve distributions intact Incision: small superficial abrasion distal aspect of the incision  Assessment:   1. Subluxation of peroneal tendon of right foot, subsequent encounter   2. 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  Post-operative State -Abx ointment and band-aid to incision daily -Refill pain rx -Trilock dispensed -Refer to PT. Start ROM exercises. Progress WB to Ankle brace and sneaker in 2 weeks.  No follow-ups on file.

## 2020-01-15 NOTE — Progress Notes (Signed)
Patient underwent surgical invention yesterday and presents today for unscheduled visit to evaluate dressing due to bleeding.  There was scant bleeding through the dressing the Ace bandage was removed part of the white gauze bandaging was removed and the dressing was reapplied.  Patient states his pain was controlled no vaginal nausea vomiting fever chills no signs of infection.  Patient to follow-up as scheduled

## 2020-01-18 ENCOUNTER — Ambulatory Visit (INDEPENDENT_AMBULATORY_CARE_PROVIDER_SITE_OTHER): Payer: Self-pay | Admitting: Podiatry

## 2020-01-18 ENCOUNTER — Other Ambulatory Visit: Payer: Self-pay

## 2020-01-18 ENCOUNTER — Telehealth: Payer: Self-pay | Admitting: *Deleted

## 2020-01-18 VITALS — Temp 95.9°F

## 2020-01-18 DIAGNOSIS — IMO0001 Reserved for inherently not codable concepts without codable children: Secondary | ICD-10-CM

## 2020-01-18 DIAGNOSIS — Z9889 Other specified postprocedural states: Secondary | ICD-10-CM

## 2020-01-18 DIAGNOSIS — S9304XD Dislocation of right ankle joint, subsequent encounter: Secondary | ICD-10-CM

## 2020-01-18 DIAGNOSIS — S92211K Displaced fracture of cuboid bone of right foot, subsequent encounter for fracture with nonunion: Secondary | ICD-10-CM

## 2020-01-18 DIAGNOSIS — M722 Plantar fascial fibromatosis: Secondary | ICD-10-CM

## 2020-01-18 MED ORDER — OXYCODONE-ACETAMINOPHEN 5-325 MG PO TABS: 1.0000 | ORAL_TABLET | ORAL | 0 refills | Status: DC | PRN

## 2020-01-18 NOTE — Telephone Encounter (Signed)
-----   Message from Park Liter, DPM sent at 01/18/2020  9:09 AM EDT ----- Can we check on status of PT referral. Last visit I filled out a form for [employer]

## 2020-01-18 NOTE — Progress Notes (Signed)
  Subjective:  Patient ID: Daniel Hanson, male    DOB: 24-Nov-1965,  MRN: [id number]  Chief Complaint  Patient presents with  . Routine Post Op    DOS 12/13/2019 RT FOOT OPEN REDUCTION INTERNAL FIXATION/REPAIR NON-UNION OF CUBOID FX VS EXCISION OF BROKEN BONE, POSS PERONEAL TENDON REPAIR, ANKLE ARTHROSCOPY W/DRILLING, EPF RT, POSS HEEL SPUR RES. Taking oxycodone TID - requests refill. Unable to rate pain. No new complaints.    DOS: 12/13/19 Procedure: Rt foot ankle arthroscopy with microfracture of OCD, EPF, Repair of cuboid non-union with excision of bone.  54 y.o. male presents with the above complaint. History confirmed with patient. States overall pain is much better than before surgery but not at 100% yet. Objective:  Physical Exam: only mild tenderness the heel and lateral ankle none at the peroneals cuboid area.  All nerve distributions intact Incision: small superficial abrasion distal aspect of the incision. Staple intact distal aspect of lateral incision  Assessment:   1. Subluxation of peroneal tendon of right foot, subsequent encounter   2. 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 Post-operative state     Plan:  Patient was evaluated and treated and all questions answered.  Post-operative State -Abx ointment and band-aid to incision daily -Additional staple removed -Refill pain rx -WBAT in ankle brace and sneaker -F/u in 1 month -Will check on status of PT referral   No follow-ups on file.

## 2020-01-18 NOTE — Telephone Encounter (Signed)
[facility] - Daniel Hanson states she has not seen orders on pt.

## 2020-01-18 NOTE — Telephone Encounter (Signed)
PT Eval and treat.  Work on ROM, AROM, scar massage, other modalities PRN at PT discretion

## 2020-01-19 NOTE — Telephone Encounter (Signed)
Faxed required form, [employer] and demographics to [employer].

## 2020-01-23 ENCOUNTER — Telehealth: Payer: Self-pay | Admitting: *Deleted

## 2020-01-23 NOTE — Telephone Encounter (Signed)
[facility] [facility] request copy of [facility] PT orders from Dr. Samuella Cota. I faxed to [facility] with their cover sheet and [employer] script, noted with Reference:  [id number].

## 2020-01-25 ENCOUNTER — Telehealth: Payer: Self-pay | Admitting: Podiatry

## 2020-01-25 ENCOUNTER — Other Ambulatory Visit: Payer: Self-pay | Admitting: Sports Medicine

## 2020-01-25 MED ORDER — OXYCODONE-ACETAMINOPHEN 5-325 MG PO TABS: 1.0000 | ORAL_TABLET | ORAL | 0 refills | Status: DC | PRN

## 2020-01-25 NOTE — Addendum Note (Signed)
Addended by: Ventura Sellers on: 01/25/2020 04:10 PM   Modules accepted: Orders

## 2020-01-25 NOTE — Telephone Encounter (Signed)
Dr. Samuella Cota already refilled it

## 2020-01-25 NOTE — Progress Notes (Signed)
Dr. Samuella Cota already sent refill

## 2020-01-25 NOTE — Telephone Encounter (Signed)
Pt called and wanted refill on medication also needs another letter out to go to workers comp please assist

## 2020-01-25 NOTE — Telephone Encounter (Addendum)
Pt states he needs refill of the percocet and note to be out of work. Pt requested to have letter sent to his girlfriend's address, [address] [place], [place] [address]. Mailed letter to pt.

## 2020-01-26 NOTE — Telephone Encounter (Signed)
I acidentally deleted it trying to get it out of my box

## 2020-02-01 ENCOUNTER — Telehealth: Payer: Self-pay | Admitting: Podiatry

## 2020-02-01 MED ORDER — OXYCODONE-ACETAMINOPHEN 5-325 MG PO TABS: 1.0000 | ORAL_TABLET | ORAL | 0 refills | Status: DC | PRN

## 2020-02-01 NOTE — Addendum Note (Signed)
Addended by: Ventura Sellers on: 02/01/2020 12:56 PM   Modules accepted: Orders

## 2020-02-01 NOTE — Telephone Encounter (Signed)
Pt called requesting pain medication

## 2020-02-08 ENCOUNTER — Telehealth: Payer: Self-pay | Admitting: *Deleted

## 2020-02-08 MED ORDER — IBUPROFEN 600 MG PO TABS: 600.0000 mg | ORAL_TABLET | Freq: Four times a day (QID) | ORAL | 0 refills | Status: DC | PRN

## 2020-02-08 MED ORDER — OXYCODONE-ACETAMINOPHEN 5-325 MG PO TABS: 1.0000 | ORAL_TABLET | ORAL | 0 refills | Status: DC | PRN

## 2020-02-08 NOTE — Telephone Encounter (Signed)
Pt called states he would like a refill of the pain medication and the ibuprofen 600mg , PT is making the foot very sore.

## 2020-02-15 ENCOUNTER — Telehealth: Payer: Self-pay | Admitting: *Deleted

## 2020-02-15 ENCOUNTER — Other Ambulatory Visit: Payer: Self-pay | Admitting: Podiatry

## 2020-02-15 MED ORDER — OXYCODONE-ACETAMINOPHEN 5-325 MG PO TABS: 1.0000 | ORAL_TABLET | Freq: Four times a day (QID) | ORAL | 0 refills | Status: DC | PRN

## 2020-02-15 NOTE — Telephone Encounter (Signed)
Pt called request pain medication.

## 2020-02-15 NOTE — Telephone Encounter (Signed)
I spoke with pt and asked him to describe the pain. Pt states PT really is tough and he is walking some in the afternoon, and he over did it Tuesday walking, has aches and some sudden sharp biting pain, ice helps, but still has balance issues. I told pt I would inform the doctor on call and to check with his pharmacy, and inform PT of his balance issues.

## 2020-02-15 NOTE — Telephone Encounter (Signed)
sent 

## 2020-02-20 ENCOUNTER — Other Ambulatory Visit: Payer: Self-pay

## 2020-02-20 ENCOUNTER — Ambulatory Visit (INDEPENDENT_AMBULATORY_CARE_PROVIDER_SITE_OTHER): Payer: Self-pay | Admitting: Podiatry

## 2020-02-20 ENCOUNTER — Encounter: Payer: Self-pay | Admitting: Podiatry

## 2020-02-20 ENCOUNTER — Telehealth: Payer: Self-pay | Admitting: *Deleted

## 2020-02-20 DIAGNOSIS — M722 Plantar fascial fibromatosis: Secondary | ICD-10-CM

## 2020-02-20 DIAGNOSIS — S92211K Displaced fracture of cuboid bone of right foot, subsequent encounter for fracture with nonunion: Secondary | ICD-10-CM

## 2020-02-20 DIAGNOSIS — Z9889 Other specified postprocedural states: Secondary | ICD-10-CM

## 2020-02-20 DIAGNOSIS — IMO0001 Reserved for inherently not codable concepts without codable children: Secondary | ICD-10-CM

## 2020-02-20 DIAGNOSIS — S9304XD Dislocation of right ankle joint, subsequent encounter: Secondary | ICD-10-CM

## 2020-02-20 MED ORDER — HYDROCODONE-ACETAMINOPHEN 5-325 MG PO TABS: 1.0000 | ORAL_TABLET | Freq: Four times a day (QID) | ORAL | 0 refills | Status: DC | PRN

## 2020-02-20 NOTE — Telephone Encounter (Signed)
Yes please he needs more visits

## 2020-02-20 NOTE — Telephone Encounter (Signed)
Faxed additional PT orders with [employer] with 02/20/2020 clinicals faxed to [facility].

## 2020-02-20 NOTE — Telephone Encounter (Signed)
-----   Message from Park Liter, DPM sent at 02/20/2020  9:30 AM EDT ----- Can we reauthorize PT

## 2020-02-20 NOTE — Progress Notes (Signed)
  Subjective:  Patient ID: Daniel Hanson, male    DOB: [birth year],  MRN: [id number]  Chief Complaint  Patient presents with  . Routine Post Op    POV DOS 6.2.2021 Right foot open reduction internal fixation/repair non-union of cubic fracture vs excision of broken bone; ankle arthroscopy with drilling of defect, endoscopic vs. open plantar fasciotomy with heel spur resection, insertion of platelet rich plasma. Pt states no concerns, healing slowly. Pt reports posterior heel/tendon fatigue with brief activity.    DOS: 12/13/19 Procedure: Rt foot ankle arthroscopy with microfracture of OCD, EPF, Repair of cuboid non-union with excision of bone.  54 y.o. male presents with the above complaint. History confirmed with patient. Has fatigue with 5 mins of activity. Objective:  Physical Exam: mild TTP at plantar heel, lateral ankle, plantar foot at area of previous cuboid bone excision. Incision: well healed.  Assessment:   1. Subluxation of peroneal tendon of right foot, subsequent encounter   2. 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 Post-operative state   4. Plantar fasciitis     Plan:  Patient was evaluated and treated and all questions answered.  Post-operative State -Overall doing well, improving slowly. Not ready for work as he cannot do more than 5 mins of activity at a time. -Refill pain rx, downgrade to Norco 5 -WBAT without Ankle brace, in sneaker -Will reathorize PT -Will attempt auth for custom orthotics. Medically necessary for reduction of pain and support for return to work. -F/u in 1 month.   Return in about 1 month (around 03/22/2020) for Post-Op (No XRs).

## 2020-02-26 ENCOUNTER — Telehealth: Payer: Self-pay | Admitting: Podiatry

## 2020-02-26 MED ORDER — HYDROCODONE-ACETAMINOPHEN 5-325 MG PO TABS: 1.0000 | ORAL_TABLET | Freq: Four times a day (QID) | ORAL | 0 refills | Status: DC | PRN

## 2020-02-26 NOTE — Telephone Encounter (Signed)
Refilled Rx. 

## 2020-02-26 NOTE — Telephone Encounter (Signed)
Pt called for medication refill

## 2020-03-07 ENCOUNTER — Telehealth: Payer: Self-pay

## 2020-03-07 NOTE — Telephone Encounter (Signed)
Pt called for refill on his pain medication.

## 2020-03-10 MED ORDER — OXYCODONE-ACETAMINOPHEN 5-325 MG PO TABS: 1.0000 | ORAL_TABLET | Freq: Four times a day (QID) | ORAL | 0 refills | Status: DC | PRN

## 2020-03-10 NOTE — Telephone Encounter (Signed)
Refilled

## 2020-03-19 ENCOUNTER — Other Ambulatory Visit: Payer: Self-pay

## 2020-03-19 ENCOUNTER — Ambulatory Visit (INDEPENDENT_AMBULATORY_CARE_PROVIDER_SITE_OTHER): Payer: Self-pay

## 2020-03-19 ENCOUNTER — Encounter: Payer: Self-pay | Admitting: Podiatry

## 2020-03-19 ENCOUNTER — Ambulatory Visit (INDEPENDENT_AMBULATORY_CARE_PROVIDER_SITE_OTHER): Payer: Worker's Compensation | Admitting: Podiatry

## 2020-03-19 DIAGNOSIS — S9304XD Dislocation of right ankle joint, subsequent encounter: Secondary | ICD-10-CM

## 2020-03-19 DIAGNOSIS — M722 Plantar fascial fibromatosis: Secondary | ICD-10-CM | POA: Diagnosis not present

## 2020-03-19 DIAGNOSIS — IMO0001 Reserved for inherently not codable concepts without codable children: Secondary | ICD-10-CM

## 2020-03-19 DIAGNOSIS — Z9889 Other specified postprocedural states: Secondary | ICD-10-CM

## 2020-03-19 DIAGNOSIS — S92211K Displaced fracture of cuboid bone of right foot, subsequent encounter for fracture with nonunion: Secondary | ICD-10-CM

## 2020-03-19 MED ORDER — OXYCODONE-ACETAMINOPHEN 5-325 MG PO TABS
1.0000 | ORAL_TABLET | Freq: Four times a day (QID) | ORAL | 0 refills | Status: DC | PRN
Start: 2020-03-19 — End: 2020-05-16

## 2020-03-20 ENCOUNTER — Telehealth: Payer: Self-pay | Admitting: Podiatrist

## 2020-03-20 NOTE — Telephone Encounter (Signed)
Beth from [facility] called re: continuation of Physical therapy for this patient.  She states he has used up his approved visits and would need another prescription for PT if you would like him to continue.    He is currently being seen 3 times per week  I can send in the rx if you want to continue.. 3 x a week for 6 more weeks?  Thanks!

## 2020-03-22 NOTE — Telephone Encounter (Signed)
Yes please continue

## 2020-03-25 NOTE — Telephone Encounter (Signed)
Hi!  Im not at the office so I wont be able to get the rx for you now-  if you can just write it out and fax it over [facility] will do the rest-  thanks!!

## 2020-04-11 NOTE — Progress Notes (Signed)
  Subjective:  Patient ID: Daniel Hanson, male    DOB: [birth year],  MRN: [id number]  Chief Complaint  Patient presents with  . Routine Post Op    right foot, heel and ankle is still sore    DOS: 12/13/19 Procedure: Rt foot ankle arthroscopy with microfracture of OCD, EPF, Repair of cuboid non-union with excision of bone.  54 y.o. male presents with the above complaint. History confirmed with patient. Has fatigue with 5 mins of activity. Objective:  Physical Exam: mild TTP at plantar heel, lateral ankle, plantar foot at area of previous cuboid bone excision. Incision: well healed.  Assessment:   1. Post-operative state   2. Subluxation of peroneal tendon of right foot, subsequent encounter   3. 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 Plantar fasciitis     Plan:  Patient was evaluated and treated and all questions answered.  Post-operative State -Continues to improve.  Continue physical therapy.  Pain medication refilled.  Once pain resolves we will consider return to work  No follow-ups on file.

## 2020-05-07 ENCOUNTER — Other Ambulatory Visit: Payer: Self-pay

## 2020-05-07 ENCOUNTER — Encounter: Payer: Self-pay | Admitting: Podiatry

## 2020-05-07 ENCOUNTER — Ambulatory Visit (INDEPENDENT_AMBULATORY_CARE_PROVIDER_SITE_OTHER): Payer: Self-pay | Admitting: Podiatry

## 2020-05-07 DIAGNOSIS — M722 Plantar fascial fibromatosis: Secondary | ICD-10-CM

## 2020-05-07 DIAGNOSIS — S92211K Displaced fracture of cuboid bone of right foot, subsequent encounter for fracture with nonunion: Secondary | ICD-10-CM

## 2020-05-12 NOTE — Progress Notes (Addendum)
  Subjective:  Patient ID: Daniel Hanson, male    DOB: Oct 14, 1965,  MRN: [id number]  Chief Complaint  Patient presents with  . Routine Post Op    DOS 6.2.2021 Pt states no concerns, occasional pain, feeling well overall.     DOS: 12/13/19 Procedure: Rt foot ankle arthroscopy with microfracture of OCD, EPF, Repair of cuboid non-union with excision of bone.  54 y.o. male presents with the above complaint. History confirmed with patient. Is doing much better ready to go back to work Objective:  Physical Exam: mild TTP at plantar heel, lateral ankle, plantar foot at area of previous cuboid bone excision. Incision: well healed.  Assessment:   1. 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 Plantar fasciitis     Plan:  Patient was evaluated and treated and all questions answered.  Post-operative State -Continues to improve. Wound is overall doing very well I think he will be ready to go back to work. I do expect him to have some level of permanent impairment from his injury but overall he should make a good recovery. Given the fracture of his cuboid and tendon entrapment I would rate his impairment of the right foot at 10% of the foot permanent impairment.  Return if symptoms worsen or fail to improve.

## 2020-05-16 ENCOUNTER — Ambulatory Visit (INDEPENDENT_AMBULATORY_CARE_PROVIDER_SITE_OTHER): Payer: Self-pay

## 2020-05-16 ENCOUNTER — Other Ambulatory Visit: Payer: Self-pay

## 2020-05-16 ENCOUNTER — Ambulatory Visit (HOSPITAL_COMMUNITY)
Admission: EM | Admit: 2020-05-16 | Discharge: 2020-05-16 | Disposition: A | Discharge status: Home / Self Care | Payer: Self-pay | Attending: Family Medicine | Admitting: Family Medicine

## 2020-05-16 ENCOUNTER — Encounter (HOSPITAL_COMMUNITY): Payer: Self-pay | Admitting: Emergency Medicine

## 2020-05-16 DIAGNOSIS — R8290 Unspecified abnormal findings in urine: Secondary | ICD-10-CM

## 2020-05-16 DIAGNOSIS — M545 Low back pain, unspecified: Secondary | ICD-10-CM

## 2020-05-16 DIAGNOSIS — I10 Essential (primary) hypertension: Secondary | ICD-10-CM

## 2020-05-16 LAB — CBC
HCT: 50.4 % (ref 39.0–52.0)
Hemoglobin: 16.8 g/dL (ref 13.0–17.0)
MCH: 32.8 pg (ref 26.0–34.0)
MCHC: 33.3 g/dL (ref 30.0–36.0)
MCV: 98.4 fL (ref 80.0–100.0)
Platelets: 187 10*3/uL (ref 150–400)
RBC: 5.12 MIL/uL (ref 4.22–5.81)
RDW: 12.9 % (ref 11.5–15.5)
WBC: 7.6 10*3/uL (ref 4.0–10.5)
nRBC: 0 % (ref 0.0–0.2)

## 2020-05-16 LAB — POCT URINALYSIS DIPSTICK, ED / UC
Glucose, UA: NEGATIVE mg/dL
Hgb urine dipstick: NEGATIVE
Ketones, ur: NEGATIVE mg/dL
Leukocytes,Ua: NEGATIVE
Nitrite: NEGATIVE
Protein, ur: NEGATIVE mg/dL
Specific Gravity, Urine: >=1.03 (ref 1.005–1.030)
Urobilinogen, UA: 0.2 mg/dL (ref 0.0–1.0)
pH: 5.5 (ref 5.0–8.0)

## 2020-05-16 LAB — COMPREHENSIVE METABOLIC PANEL
ALT: 46 U/L — ABNORMAL HIGH (ref 0–44)
AST: 30 U/L (ref 15–41)
Albumin: 4.2 g/dL (ref 3.5–5.0)
Alkaline Phosphatase: 90 U/L (ref 38–126)
Anion gap: 10 (ref 5–15)
BUN: 16 mg/dL (ref 6–20)
CO2: 26 mmol/L (ref 22–32)
Calcium: 9.4 mg/dL (ref 8.9–10.3)
Chloride: 97 mmol/L — ABNORMAL LOW (ref 98–111)
Creatinine, Ser: 0.84 mg/dL (ref 0.61–1.24)
GFR, Estimated: >60 mL/min (ref >60)
Glucose, Bld: 231 mg/dL — ABNORMAL HIGH (ref 70–99)
Potassium: 4.5 mmol/L (ref 3.5–5.1)
Sodium: 133 mmol/L — ABNORMAL LOW (ref 135–145)
Total Bilirubin: 0.5 mg/dL (ref 0.3–1.2)
Total Protein: 7.3 g/dL (ref 6.5–8.1)

## 2020-05-16 MED ORDER — NAPROXEN SODIUM 550 MG PO TABS: 550.0000 mg | ORAL_TABLET | Freq: Two times a day (BID) | ORAL | 0 refills | Status: DC

## 2020-05-16 MED ORDER — LISINOPRIL 10 MG PO TABS: 10.0000 mg | ORAL_TABLET | Freq: Every day | ORAL | 1 refills | Status: DC

## 2020-05-16 MED ORDER — OXYCODONE-ACETAMINOPHEN 5-325 MG PO TABS: 1.0000 | ORAL_TABLET | Freq: Four times a day (QID) | ORAL | 0 refills | Status: DC | PRN

## 2020-05-16 NOTE — ED Triage Notes (Signed)
Pt presents with low back pain, odor to urine, and dark urin xs 3-4 weeks.

## 2020-05-16 NOTE — ED Provider Notes (Addendum)
[facility]    CSN: [id number] Arrival date & time: 05/16/20  2703      History   Chief Complaint Chief Complaint  Patient presents with  . Back Pain    HPI Daniel Hanson is a 54 y.o. male.   HPI   Patient is here for low back pain.  He states is been bothering him for 3 to 4 weeks.  He states that he did not have any accident or injury, but thinks it is due to overuse at work.  He works as a Curator.  It hurts worse with activity and is better with rest.  No radiation into the legs, no numbness or weakness.  No urinary frequency, dysuria, fever or chills, nausea or vomiting.  He states he has noted, at times, a dark color to his urine.  He states that he has noted a strong odor to his urine.  No blood in urine.  No history of kidney stones or kidney infections. Patient has known hypertension.  He has run out of his medications.  No headaches or dizzy spells.  No chest pain or shortness of breath  Past Medical History:  Diagnosis Date  . Neck fracture [facility]) 1986    Patient Active Problem List   Diagnosis Date Noted  . Bilateral leg edema 07/20/2019  . Fatigue 07/20/2019  . Polyneuropathy 07/20/2019  . Obesity 05/06/2015  . Essential hypertension 05/06/2015  . Lumbago 04/26/2015  . Fibula fracture 04/25/2015    Past Surgical History:  Procedure Laterality Date  . IRRIGATION AND DEBRIDEMENT KNEE Left 10/09/2017   Procedure: IRRIGATION AND DEBRIDEMENT KNEE;  Surgeon: Cammy Copa, MD;  Location: [facility];  Service: Orthopedics;  Laterality: Left;       Home Medications    Prior to Admission medications   Medication Sig Start Date End Date Taking? Authorizing Provider  furosemide (LASIX) 40 MG tablet Take 1 tablet (40 mg total) by mouth daily. 10/10/19   LampteyBritta Mccreedy, MD  ibuprofen (ADVIL) 600 MG tablet Take 1 tablet (600 mg total) by mouth every 6 (six) hours as needed for moderate pain. 02/08/20   Park Liter, DPM  lisinopril  (ZESTRIL) 10 MG tablet Take 1 tablet (10 mg total) by mouth daily. 05/16/20   Eustace Moore, MD  naproxen sodium (ANAPROX DS) 550 MG tablet Take 1 tablet (550 mg total) by mouth 2 (two) times daily with a meal. 05/16/20   Eustace Moore, MD  oxyCODONE-acetaminophen (PERCOCET) 5-325 MG tablet Take 1 tablet by mouth every 6 (six) hours as needed for severe pain. 05/16/20   Eustace Moore, MD  promethazine (PHENERGAN) 12.5 MG tablet Take 1 tablet (12.5 mg total) by mouth every 6 (six) hours as needed. 08/06/18 06/22/19  Ivery Quale, PA-C    Family History Family History  Problem Relation Age of Onset  . Stroke Father   . Parkinson's disease Father     Social History Social History   Tobacco Use  . Smoking status: Current Every Day Smoker    Packs/day: 1.00    Types: Cigarettes  . Smokeless tobacco: Never Used  . Tobacco comment: 5-15 cigs per day  Vaping Use  . Vaping Use: Never used  Substance Use Topics  . Alcohol use: Yes    Comment: occasional  . Drug use: No     Allergies   Patient has no known allergies.   Review of Systems Review of Systems See HPI  Physical Exam Triage  Vital Signs ED Triage Vitals  Enc Vitals Group     BP 05/16/20 0833 (!) 146/111     Pulse Rate 05/16/20 0833 91     Resp 05/16/20 0833 19     Temp 05/16/20 0833 97.9 F (36.6 C)     Temp Source 05/16/20 0833 Oral     SpO2 05/16/20 0833 100 %     Weight --      Height --      Head Circumference --      Peak Flow --      Pain Score 05/16/20 0830 4     Pain Loc --      Pain Edu? --      Excl. in GC? --    No data found.  Updated Vital Signs BP (!) 145/99 (BP Location: Right Arm)   Pulse 91   Temp 97.9 F (36.6 C) (Oral)   Resp 19   SpO2 96%      Physical Exam Constitutional:      General: He is not in acute distress.    Appearance: He is well-developed. He is obese.     Comments: No acute distress  HENT:     Head: Normocephalic and atraumatic.  Eyes:      Conjunctiva/sclera: Conjunctivae normal.     Pupils: Pupils are equal, round, and reactive to light.  Cardiovascular:     Rate and Rhythm: Normal rate.  Pulmonary:     Effort: Pulmonary effort is normal. No respiratory distress.  Abdominal:     General: There is no distension.     Palpations: Abdomen is soft.     Tenderness: There is no right CVA tenderness or left CVA tenderness.     Comments: Protuberant abdomen  Musculoskeletal:        General: Normal range of motion.     Cervical back: Normal range of motion.       Back:  Skin:    General: Skin is warm and dry.  Neurological:     Mental Status: He is alert.     Motor: No weakness.     Gait: Gait normal.     Deep Tendon Reflexes: Reflexes normal.  Psychiatric:        Mood and Affect: Mood normal.        Behavior: Behavior normal.      UC Treatments / Results  Labs (all labs ordered are listed, but only abnormal results are displayed) Labs Reviewed  COMPREHENSIVE METABOLIC PANEL - Abnormal; Notable for the following components:      Result Value   Sodium 133 (*)    Chloride 97 (*)    Glucose, Bld 231 (*)    ALT 46 (*)    All other components within normal limits  POCT URINALYSIS DIPSTICK, ED / UC - Abnormal; Notable for the following components:   Bilirubin Urine SMALL (*)    All other components within normal limits  URINE CULTURE  CBC    EKG   Radiology DG Lumbar Spine Complete  Result Date: 05/16/2020 CLINICAL DATA:  54 year old male with low back pain. Dark urine with abnormal odor. Pain at L1. EXAM: LUMBAR SPINE - COMPLETE 4+ VIEW COMPARISON:  None. FINDINGS: Transitional lumbosacral anatomy. Partially lumbarized S1 level suspected. Bone mineralization is within normal limits. Normal lumbar lordosis. Mild levoconvex lumbar scoliosis. Preserved disc spaces. No pars fracture. Mild lower lumbar facet hypertrophy. Visible sacrum and SI joints appear normal. No acute osseous abnormality identified. Negative  abdominal  visceral contours. No nephrolithiasis is evident. IMPRESSION: 1. No acute osseous abnormality identified in the lumbar spine. 2. Mild levoconvex lumbar scoliosis. Transitional lumbosacral anatomy, suspect partially lumbarized S1 level. Electronically Signed   By: Odessa Fleming M.D.   On: 05/16/2020 10:00    Procedures Procedures (including critical care time)  Medications Ordered in UC Medications - No data to display  Initial Impression / Assessment and Plan / UC Course  I have reviewed the triage vital signs and the nursing notes.  Pertinent labs & imaging results that were available during my care of the patient were reviewed by me and considered in my medical decision making (see chart for details).  Clinical Course as of May 16 1501  Thu May 16, 2020  1024 POCT Urinalysis, Dipstick [YN]    Clinical Course User Index [YN] Eustace Moore, MD    Urine culture is pending.  Low suspicion for UTI.  Believe his urinary symptoms are due to concentrated urine, possibly with the new onset of diabetes. With a blood sugar of 230 when he does meet the criteria for diabetes.  He needs to work assiduously on diet and exercise in order not to end up on medication, and be at risk of diabetes complications.  He is referred back to his PCP He is treated for mechanical back pain. Final Clinical Impressions(s) / UC Diagnoses   Final diagnoses:  Acute midline low back pain without sciatica  Abnormal urine  Essential hypertension     Discharge Instructions     Stop ibuprofen Take Anaprox 2 times a day with food.  This is an anti-inflammatory pain medication Take Percocet as needed for severe pain.  Do not drive on Percocet You can check your test results on MyChart.  We will call you if any of your test results are abnormal Drink more water Follow-up as needed Your blood pressure is elevated.  I have given you a refill of your blood pressure medication.  You need to follow-up with  your primary care for additional refills    ED Prescriptions    Medication Sig Dispense Auth. Provider   oxyCODONE-acetaminophen (PERCOCET) 5-325 MG tablet Take 1 tablet by mouth every 6 (six) hours as needed for severe pain. 15 tablet Eustace Moore, MD   naproxen sodium (ANAPROX DS) 550 MG tablet Take 1 tablet (550 mg total) by mouth 2 (two) times daily with a meal. 30 tablet Eustace Moore, MD   lisinopril (ZESTRIL) 10 MG tablet Take 1 tablet (10 mg total) by mouth daily. 30 tablet Eustace Moore, MD     I have reviewed the PDMP during this encounter.   Eustace Moore, MD 05/16/20 1501    Eustace Moore, MD 05/16/20 [phone]

## 2020-05-16 NOTE — Discharge Instructions (Signed)
Stop ibuprofen Take Anaprox 2 times a day with food.  This is an anti-inflammatory pain medication Take Percocet as needed for severe pain.  Do not drive on Percocet You can check your test results on MyChart.  We will call you if any of your test results are abnormal Drink more water Follow-up as needed Your blood pressure is elevated.  I have given you a refill of your blood pressure medication.  You need to follow-up with your primary care for additional refills

## 2020-05-17 LAB — URINE CULTURE: Culture: <10000 — AB

## 2020-06-09 ENCOUNTER — Emergency Department (HOSPITAL_COMMUNITY): Payer: Self-pay

## 2020-06-09 ENCOUNTER — Encounter (HOSPITAL_COMMUNITY): Payer: Self-pay

## 2020-06-09 ENCOUNTER — Other Ambulatory Visit: Payer: Self-pay

## 2020-06-09 ENCOUNTER — Emergency Department (HOSPITAL_COMMUNITY)
Admission: EM | Admit: 2020-06-09 | Discharge: 2020-06-09 | Disposition: A | Discharge status: Home / Self Care | Payer: Self-pay | Attending: Emergency Medicine | Admitting: Emergency Medicine

## 2020-06-09 DIAGNOSIS — I10 Essential (primary) hypertension: Secondary | ICD-10-CM | POA: Insufficient documentation

## 2020-06-09 DIAGNOSIS — W19XXXA Unspecified fall, initial encounter: Secondary | ICD-10-CM

## 2020-06-09 DIAGNOSIS — W010XXA Fall on same level from slipping, tripping and stumbling without subsequent striking against object, initial encounter: Secondary | ICD-10-CM | POA: Insufficient documentation

## 2020-06-09 DIAGNOSIS — Z79899 Other long term (current) drug therapy: Secondary | ICD-10-CM | POA: Insufficient documentation

## 2020-06-09 DIAGNOSIS — S8001XA Contusion of right knee, initial encounter: Secondary | ICD-10-CM | POA: Insufficient documentation

## 2020-06-09 DIAGNOSIS — F17210 Nicotine dependence, cigarettes, uncomplicated: Secondary | ICD-10-CM | POA: Insufficient documentation

## 2020-06-09 NOTE — ED Provider Notes (Signed)
[facility] EMERGENCY DEPARTMENT Provider Note   CSN: [id number] Arrival date & time: 06/09/20  1020     History Chief Complaint  Patient presents with  . Fall    Daniel Hanson is a 54 y.o. male.  Patient states he fell and tripped and landed on his right leg.  Patient has minimal pain to the right knee  The history is provided by the patient. No language interpreter was used.  Fall This is a new problem. The current episode started 12 to 24 hours ago. The problem occurs rarely. The problem has been resolved. Pertinent negatives include no chest pain, no abdominal pain and no headaches. Nothing aggravates the symptoms. Nothing relieves the symptoms. The treatment provided no relief.       Past Medical History:  Diagnosis Date  . Neck fracture [facility]) 1986    Patient Active Problem List   Diagnosis Date Noted  . Bilateral leg edema 07/20/2019  . Fatigue 07/20/2019  . Polyneuropathy 07/20/2019  . Obesity 05/06/2015  . Essential hypertension 05/06/2015  . Lumbago 04/26/2015  . Fibula fracture 04/25/2015    Past Surgical History:  Procedure Laterality Date  . IRRIGATION AND DEBRIDEMENT KNEE Left 10/09/2017   Procedure: IRRIGATION AND DEBRIDEMENT KNEE;  Surgeon: Cammy Copa, MD;  Location: [facility] ORS;  Service: Orthopedics;  Laterality: Left;       Family History  Problem Relation Age of Onset  . Stroke Father   . Parkinson's disease Father     Social History   Tobacco Use  . Smoking status: Current Every Day Smoker    Packs/day: 1.00    Types: Cigarettes  . Smokeless tobacco: Never Used  . Tobacco comment: 5-15 cigs per day  Vaping Use  . Vaping Use: Never used  Substance Use Topics  . Alcohol use: Yes  . Drug use: No    Home Medications Prior to Admission medications   Medication Sig Start Date End Date Taking? Authorizing Provider  furosemide (LASIX) 40 MG tablet Take 1 tablet (40 mg total) by mouth daily. 10/10/19   LampteyBritta Mccreedy,  MD  ibuprofen (ADVIL) 600 MG tablet Take 1 tablet (600 mg total) by mouth every 6 (six) hours as needed for moderate pain. 02/08/20   Park Liter, DPM  lisinopril (ZESTRIL) 10 MG tablet Take 1 tablet (10 mg total) by mouth daily. 05/16/20   Eustace Moore, MD  naproxen sodium (ANAPROX DS) 550 MG tablet Take 1 tablet (550 mg total) by mouth 2 (two) times daily with a meal. 05/16/20   Eustace Moore, MD  oxyCODONE-acetaminophen (PERCOCET) 5-325 MG tablet Take 1 tablet by mouth every 6 (six) hours as needed for severe pain. 05/16/20   Eustace Moore, MD  promethazine (PHENERGAN) 12.5 MG tablet Take 1 tablet (12.5 mg total) by mouth every 6 (six) hours as needed. 08/06/18 06/22/19  Ivery Quale, PA-C    Allergies    Patient has no known allergies.  Review of Systems   Review of Systems  Constitutional: Negative for appetite change and fatigue.  HENT: Negative for congestion, ear discharge and sinus pressure.   Eyes: Negative for discharge.  Respiratory: Negative for cough.   Cardiovascular: Negative for chest pain.  Gastrointestinal: Negative for abdominal pain and diarrhea.  Genitourinary: Negative for frequency and hematuria.  Musculoskeletal: Negative for back pain.       Right knee and right lower leg pain  Skin: Negative for rash.  Neurological: Negative for seizures and headaches.  Psychiatric/Behavioral: Negative for hallucinations.    Physical Exam Updated Vital Signs BP 122/83 (BP Location: Right Arm)   Pulse (!) 101   Temp 98.3 F (36.8 C) (Oral)   Resp 20   Ht 5\' 11"  (1.803 m)   Wt 136.1 kg   SpO2 96%   BMI 41.84 kg/m   Physical Exam Vitals and nursing note reviewed.  Constitutional:      Appearance: He is well-developed.  HENT:     Head: Normocephalic.     Nose: Nose normal.  Eyes:     General: No scleral icterus.    Conjunctiva/sclera: Conjunctivae normal.  Neck:     Thyroid: No thyromegaly.  Cardiovascular:     Rate and Rhythm: Normal rate  and regular rhythm.     Heart sounds: No murmur heard.  No friction rub. No gallop.   Pulmonary:     Breath sounds: No stridor. No wheezing or rales.  Chest:     Chest wall: No tenderness.  Abdominal:     General: There is no distension.     Tenderness: There is no abdominal tenderness. There is no rebound.  Musculoskeletal:     Cervical back: Neck supple.     Comments: Tenderness right knee  Lymphadenopathy:     Cervical: No cervical adenopathy.  Skin:    Findings: No erythema or rash.  Neurological:     Mental Status: He is alert and oriented to person, place, and time.     Motor: No abnormal muscle tone.     Coordination: Coordination normal.  Psychiatric:        Behavior: Behavior normal.     ED Results / Procedures / Treatments   Labs (all labs ordered are listed, but only abnormal results are displayed) Labs Reviewed - No data to display  EKG None  Radiology DG Tibia/Fibula Right  Result Date: 06/09/2020 CLINICAL DATA:  Pain following fall EXAM: RIGHT TIBIA AND FIBULA - 2 VIEW COMPARISON:  None. FINDINGS: Frontal and lateral views were obtained. No fracture or dislocation. No abnormal periosteal reaction. Joint spaces appear normal. No evident knee or ankle joint effusion. IMPRESSION: No fracture or dislocation.  No evident arthropathy. Electronically Signed   By: 06/11/2020 III M.D.   On: 06/09/2020 12:12   DG Knee Right Port  Result Date: 06/09/2020 CLINICAL DATA:  Pain following fall EXAM: PORTABLE RIGHT KNEE - 1-2 VIEW COMPARISON:  None. FINDINGS: Frontal and lateral views were obtained. No fracture or dislocation. No joint effusion. Joint spaces appear normal. No erosive change. IMPRESSION: No fracture or dislocation. No joint effusion. No appreciable underlying arthropathy. Electronically Signed   By: 06/11/2020 III M.D.   On: 06/09/2020 12:12    Procedures Procedures (including critical care time)  Medications Ordered in ED Medications - No  data to display  ED Course  I have reviewed the triage vital signs and the nursing notes.  Pertinent labs & imaging results that were available during my care of the patient were reviewed by me and considered in my medical decision making (see chart for details).    MDM Rules/Calculators/A&P                          Patient with fall contusion to right knee.  X-rays negative.  He will take Tylenol follow up with orthopedics if needed Final Clinical Impression(s) / ED Diagnoses Final diagnoses:  Fall  Contusion of right knee, initial encounter  Rx / DC Orders ED Discharge Orders    None       Bethann Berkshire, MD 06/11/20 [phone]

## 2020-06-09 NOTE — Discharge Instructions (Signed)
Tylenol for pain.   Follow-up with your family doctor or Dr. Romeo Apple for your knee.  Follow-up with your family doctor to get a sleep study done because I believe he may have sleep apnea

## 2020-06-09 NOTE — ED Notes (Signed)
Pt has been ambulatory in the room.  Pt eating and drinking in the room, instructed pt to wait til EDP assessment and xrays done.  Pt had fell after tripping over a towel at a hotel room this morning.  Pt denies hitting his head.

## 2020-06-09 NOTE — ED Triage Notes (Signed)
Pt to er, pt states that he tripped on a towel and fell this am.  Pt states that he has some pain in his R lower leg.  Pt ambulatory to wheel chair in waiting room

## 2020-06-09 NOTE — ED Triage Notes (Signed)
Pt brought to ED via [facility] following fall at [facility]. Pt was out walking in the parking lot when EMS arrived. Pt c/o R leg pain and lower back pain per EMS.

## 2020-06-14 ENCOUNTER — Telehealth: Payer: Self-pay | Admitting: Podiatry

## 2020-06-14 ENCOUNTER — Encounter: Payer: Self-pay | Admitting: Podiatry

## 2020-06-14 NOTE — Telephone Encounter (Signed)
Attempted to call patient, no answer message left with his wife to call back.  Will have note available for pickup.  Will addend note with long-term issues and disability rating.

## 2020-06-14 NOTE — Telephone Encounter (Signed)
Daniel Hanson can you do me a favor? Can you write a note releasing him back to work and keep it up front for him to pick-up?

## 2020-06-14 NOTE — Telephone Encounter (Signed)
Patient walked into the office on 12/2/2021requesting a note to be released to go back to work. He states he needs a note that he can go back full time. He also states he needed a rating for his workers comp and also in the note if there would be any future long-term issues that may arise as a result of the workers comp injury. Please assist this patient. Thanks.

## 2020-06-21 NOTE — Telephone Encounter (Signed)
Can you let him know that his note is updated with impairment info?

## 2020-06-24 NOTE — Telephone Encounter (Signed)
Called pt and LM.

## 2020-08-19 ENCOUNTER — Encounter: Payer: Self-pay | Admitting: Student

## 2020-09-04 ENCOUNTER — Ambulatory Visit (INDEPENDENT_AMBULATORY_CARE_PROVIDER_SITE_OTHER): Payer: Self-pay | Admitting: Student

## 2020-09-04 ENCOUNTER — Encounter: Payer: Self-pay | Admitting: Student

## 2020-09-04 ENCOUNTER — Other Ambulatory Visit: Payer: Self-pay

## 2020-09-04 VITALS — BP 125/94 | HR 88 | Temp 97.9°F | Ht 70.5 in | Wt 297.9 lb

## 2020-09-04 DIAGNOSIS — R600 Localized edema: Secondary | ICD-10-CM

## 2020-09-04 DIAGNOSIS — Z0000 Encounter for general adult medical examination without abnormal findings: Secondary | ICD-10-CM | POA: Insufficient documentation

## 2020-09-04 DIAGNOSIS — F419 Anxiety disorder, unspecified: Secondary | ICD-10-CM

## 2020-09-04 DIAGNOSIS — Z23 Encounter for immunization: Secondary | ICD-10-CM

## 2020-09-04 DIAGNOSIS — E119 Type 2 diabetes mellitus without complications: Secondary | ICD-10-CM

## 2020-09-04 DIAGNOSIS — G629 Polyneuropathy, unspecified: Secondary | ICD-10-CM

## 2020-09-04 DIAGNOSIS — F32A Depression, unspecified: Secondary | ICD-10-CM

## 2020-09-04 DIAGNOSIS — I10 Essential (primary) hypertension: Secondary | ICD-10-CM

## 2020-09-04 DIAGNOSIS — R7303 Prediabetes: Secondary | ICD-10-CM

## 2020-09-04 LAB — GLUCOSE, CAPILLARY: Glucose-Capillary: 139 mg/dL — ABNORMAL HIGH (ref 70–99)

## 2020-09-04 LAB — POCT GLYCOSYLATED HEMOGLOBIN (HGB A1C): Hemoglobin A1C: 7.4 % — AB (ref 4.0–5.6)

## 2020-09-04 MED ORDER — GABAPENTIN 300 MG PO CAPS: 300.0000 mg | ORAL_CAPSULE | Freq: Three times a day (TID) | ORAL | 2 refills | Status: AC | PRN

## 2020-09-04 MED ORDER — LISINOPRIL 10 MG PO TABS: 10.0000 mg | ORAL_TABLET | Freq: Every day | ORAL | 1 refills | Status: AC

## 2020-09-04 MED ORDER — DICLOFENAC SODIUM 1 % EX GEL: 2.0000 g | Freq: Four times a day (QID) | CUTANEOUS | 3 refills | Status: AC

## 2020-09-04 MED ORDER — FUROSEMIDE 40 MG PO TABS: 40.0000 mg | ORAL_TABLET | Freq: Every day | ORAL | 1 refills | Status: AC

## 2020-09-04 NOTE — Assessment & Plan Note (Addendum)
Patient with history of prediabetes with HbA1c of 6.4% one year ago. Will repeat HbA1c today and obtain BMP to assess kidney function given concomitant HTN.  ADDENDUM: Patient found to have HbA1c of 7.4% today so he has progressed to diabetes. I do not believe this was the cause of his polyneuropathy at this time as patient's polyneuropathy began even when his glucose was well controlled. Unable to reach patient via phone call so will try again tomorrow.

## 2020-09-04 NOTE — Assessment & Plan Note (Addendum)
Patient with GAD-7 of 19 and PHQ-9 of 18. Denies any suicidal ideation or homicidal ideation. Due to lack of time, I discussed with patient about having a telehealth encounter in 2 weeks to discuss patient's anxiety and depression and determine if there is an underlying etiology (weight, symptomatic neuropathy, family hardships,etc) that can be targeted with counseling. He agrees with this.

## 2020-09-04 NOTE — Assessment & Plan Note (Signed)
Received flu shot today. 

## 2020-09-04 NOTE — Progress Notes (Signed)
   CC: numbness and tingling in both hands and feet  HPI:  Daniel Hanson is a 55 y.o. male with history listed below presenting to the [facility] for numbness and tingling in bilateral hands and feet (stocking-glove distribution). Please see individualized problem based charting for full HPI.  Past Medical History:  Diagnosis Date  . Neck fracture (HCC) 1986    Review of Systems:  Negative aside from that listed in individualized problem based charting.  Physical Exam:  Vitals:   09/04/20 1000  BP: (!) 125/94  Pulse: 88  Temp: 97.9 F (36.6 C)  TempSrc: Oral  SpO2: 98%  Weight: 297 lb 14.4 oz (135.1 kg)  Height: 5' 10.5" (1.791 m)   Physical Exam Constitutional:      Appearance: He is obese. He is not ill-appearing.  HENT:     Head: Normocephalic and atraumatic.     Mouth/Throat:     Mouth: Mucous membranes are moist.     Pharynx: Oropharynx is clear. No oropharyngeal exudate.  Eyes:     Conjunctiva/sclera: Conjunctivae normal.     Pupils: Pupils are equal, round, and reactive to light.  Cardiovascular:     Rate and Rhythm: Normal rate and regular rhythm.     Pulses: Normal pulses.     Heart sounds: Normal heart sounds. No murmur heard. No friction rub. No gallop.   Pulmonary:     Effort: Pulmonary effort is normal.     Breath sounds: Normal breath sounds. No wheezing, rhonchi or rales.  Abdominal:     General: Bowel sounds are normal. There is no distension.     Palpations: Abdomen is soft.     Tenderness: There is no abdominal tenderness.  Musculoskeletal:        General: Normal range of motion.     Cervical back: Normal range of motion.     Comments: 1+ swelling in bilateral lower extremities.  Skin:    General: Skin is warm and dry.  Neurological:     Mental Status: He is alert and oriented to person, place, and time.     Comments: Loss of sensation in bilateral hands up to wrists and loss of sensation in bilateral feet up to ankles. Coordination is  intact, but proprioception is mildly impaired. Normal gait.   Psychiatric:     Comments: Slightly anxious.      Assessment & Plan:   See Encounters Tab for problem based charting.  Patient discussed with Dr. Mayford Knife

## 2020-09-04 NOTE — Patient Instructions (Signed)
Daniel Hanson,  It was a pleasure seeing you in the clinic today.   We discussed the following today:  1. We are drawing some labs today, and I will call you with the results.  2. I have refilled your home lasix and lisinopril. I have also prescribed you the topical voltaren gel for your shoulder and leg pain.   3. I have prescribed you a nerve pain medication called gabapentin.  4. I have placed a referral to neurology so that they can further evaluate your numbness and tingling symptoms.  5. You received your flu shot today.  Please call our clinic at [phone] if you have any questions or concerns. The best time to call is Monday-Friday from 9am-4pm, but there is someone available 24/7 at the same number. If you need medication refills, please notify your pharmacy one week in advance and they will send [place] a request.   Thank you for letting us take part in your care. We look forward to seeing you next time!

## 2020-09-04 NOTE — Assessment & Plan Note (Addendum)
Patient with history of bilateral lower extremity edema, likely from obesity and underlying undiagnosed OSA. He has been tolerating home lasix well, but has ran out of medication a week ago. Bilateral lower extremity swelling noted on exam. Will reorder lasix 40mg  daily. Will obtain BMP to assess electrolytes.

## 2020-09-04 NOTE — Assessment & Plan Note (Addendum)
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 He reports ongoing numbness and tingling in affected areas along with intermittent, but frequent, electric shock-like sensations in the same areas. He expresses his frustration with symptoms as they are significantly impacting his work as a Curator. He has some arthritic pain in his shoulder joints that has been bothering him as well.  He presented about one year ago with similar symptoms and HbA1c obtained for DM screening was 6.4% and a TSH was within normal limits.  Upon further inquiry, patient does report significant alcohol use in the past. He used to consume a 12-pack of beer 3-4 nights a week for "a while", but as of 2 years ago he cut back to only occasional drinking. However, he denies ever having experienced any alcohol withdrawals. Nonetheless, there is the possibility that patient's symptoms are from prior history of significant alcohol use.  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 I will also obtain vitamin B12 levels. Lastly, I will place a referral to neurology for further evaluation of patients symptoms. To manage patient's neuropathic pain, I will prescribe him gabapentin 300mg  TID prn.   Plan: -f/u HbA1c, vitamin B12 -gabapentin 300mg  TID prn for neuropathic pain -topical voltaren gel for pain -referral to neurology -if no improvement, consider EMG nerve conduction studies  ADDENDUM: Patient's HbA1c 7.4% today, although I do not think his newly-diagnosed diabetes is the cause of his polyneuropathy as patient had neuropathic symptoms even when his glucose was well controlled.

## 2020-09-05 LAB — BMP8+ANION GAP
Anion Gap: 17 mmol/L (ref 10.0–18.0)
BUN/Creatinine Ratio: 17 (ref 9–20)
BUN: 13 mg/dL (ref 6–24)
CO2: 23 mmol/L (ref 20–29)
Calcium: 9 mg/dL (ref 8.7–10.2)
Chloride: 97 mmol/L (ref 96–106)
Creatinine, Ser: 0.75 mg/dL — ABNORMAL LOW (ref 0.76–1.27)
GFR calc Af Amer: 119 mL/min/{1.73_m2} (ref >59)
GFR calc non Af Amer: 103 mL/min/{1.73_m2} (ref >59)
Glucose: 141 mg/dL — ABNORMAL HIGH (ref 65–99)
Potassium: 4.7 mmol/L (ref 3.5–5.2)
Sodium: 137 mmol/L (ref 134–144)

## 2020-09-05 LAB — VITAMIN B12: Vitamin B-12: 403 pg/mL (ref 232–1245)

## 2020-09-09 NOTE — Progress Notes (Signed)
Patient called.  Patient aware of new diagnosis of diabetes with HbA1c 7.4. He is interested in scheduling a telephone visit later this week to discuss management options for diabetes. Sent message to front desk to set this up.

## 2020-09-09 NOTE — Progress Notes (Signed)
Internal Medicine Clinic Attending  Case discussed with Dr. Jinwala at the time of the visit.  We reviewed the resident's history and exam and pertinent patient test results.  I agree with the assessment, diagnosis, and plan of care documented in the resident's note.  

## 2020-09-17 ENCOUNTER — Encounter (HOSPITAL_COMMUNITY): Payer: Self-pay | Admitting: Emergency Medicine

## 2020-09-17 ENCOUNTER — Other Ambulatory Visit: Payer: Self-pay

## 2020-09-17 ENCOUNTER — Ambulatory Visit (HOSPITAL_COMMUNITY)
Admission: EM | Admit: 2020-09-17 | Discharge: 2020-09-17 | Disposition: A | Discharge status: Home / Self Care | Payer: Self-pay | Attending: Student | Admitting: Student

## 2020-09-17 DIAGNOSIS — A084 Viral intestinal infection, unspecified: Secondary | ICD-10-CM

## 2020-09-17 DIAGNOSIS — I10 Essential (primary) hypertension: Secondary | ICD-10-CM

## 2020-09-17 DIAGNOSIS — F17200 Nicotine dependence, unspecified, uncomplicated: Secondary | ICD-10-CM

## 2020-09-17 DIAGNOSIS — R112 Nausea with vomiting, unspecified: Secondary | ICD-10-CM

## 2020-09-17 HISTORY — DX: Essential (primary) hypertension: I10

## 2020-09-17 MED ORDER — ONDANSETRON 8 MG PO TBDP: 8.0000 mg | ORAL_TABLET | Freq: Three times a day (TID) | ORAL | 0 refills | Status: AC | PRN

## 2020-09-17 MED ORDER — PROBIOTIC 250 MG PO CAPS: 1.0000 | ORAL_CAPSULE | Freq: Every day | ORAL | 0 refills | Status: AC

## 2020-09-17 NOTE — ED Triage Notes (Addendum)
Patient c/o emesis x 1 day.   Patient endorses "significant diarrhea" upon onset of symptoms, which is now relieved.   Patient endorses ABD pain at times.   Patient endorses decreased food intake.   Patient has taken Imodium and Pedialyte with relief of symptoms.   History of Hypertension.

## 2020-09-17 NOTE — ED Provider Notes (Signed)
[facility]    CSN: [id number] Arrival date & time: 09/17/20  1241      History   Chief Complaint Chief Complaint  Patient presents with  . Emesis    HPI Daniel Hanson is a 55 y.o. male presenting with vomiting and generalized crampy abdominal pain.  History diabetes, anxiety and depression, leg edema, fatigue, polyneuropathy, obesity, hypertension, lumbago, fibula fracture.  States he has been throwing up for 1 day. Has vomited twice today..  Initially with significant diarrhea but this is completely resolved for the last 4 days with Imodium and Pedialyte.  Endorses decreased appetite.   States he didn't take his antihypertensives last week.  He is currently back on these.  Denies chest pain, shortness of breath, vision changes.    HPI  Past Medical History:  Diagnosis Date  . Hypertension   . Neck fracture [facility]) 1986    Patient Active Problem List   Diagnosis Date Noted  . Healthcare maintenance 09/04/2020  . Type 2 diabetes mellitus (HCC) 09/04/2020  . Anxiety and depression 09/04/2020  . Bilateral leg edema 07/20/2019  . Fatigue 07/20/2019  . Polyneuropathy 07/20/2019  . Obesity 05/06/2015  . Essential hypertension 05/06/2015  . Lumbago 04/26/2015  . Fibula fracture 04/25/2015    Past Surgical History:  Procedure Laterality Date  . IRRIGATION AND DEBRIDEMENT KNEE Left 10/09/2017   Procedure: IRRIGATION AND DEBRIDEMENT KNEE;  Surgeon: Cammy Copa, MD;  Location: [facility];  Service: Orthopedics;  Laterality: Left;       Home Medications    Prior to Admission medications   Medication Sig Start Date End Date Taking? Authorizing Provider  ondansetron (ZOFRAN ODT) 8 MG disintegrating tablet Take 1 tablet (8 mg total) by mouth every 8 (eight) hours as needed for nausea or vomiting. 09/17/20  Yes Rhys Martini, PA-C  Saccharomyces boulardii (PROBIOTIC) 250 MG CAPS Take 1 capsule by mouth daily. 09/17/20  Yes Rhys Martini, PA-C   diclofenac Sodium (VOLTAREN) 1 % GEL Apply 2 g topically 4 (four) times daily. Apply topical gel to affected area 4 (four) times daily as needed. 09/04/20   Merrilyn Puma, MD  furosemide (LASIX) 40 MG tablet Take 1 tablet (40 mg total) by mouth daily. 09/04/20   Merrilyn Puma, MD  gabapentin (NEURONTIN) 300 MG capsule Take 1 capsule (300 mg total) by mouth 3 (three) times daily as needed. 09/04/20   Merrilyn Puma, MD  ibuprofen (ADVIL) 600 MG tablet Take 1 tablet (600 mg total) by mouth every 6 (six) hours as needed for moderate pain. 02/08/20   Park Liter, DPM  lisinopril (ZESTRIL) 10 MG tablet Take 1 tablet (10 mg total) by mouth daily. 09/04/20   Merrilyn Puma, MD  promethazine (PHENERGAN) 12.5 MG tablet Take 1 tablet (12.5 mg total) by mouth every 6 (six) hours as needed. 08/06/18 06/22/19  Ivery Quale, PA-C    Family History Family History  Problem Relation Age of Onset  . Stroke Father   . Parkinson's disease Father     Social History Social History   Tobacco Use  . Smoking status: Current Every Day Smoker    Packs/day: 1.00    Types: Cigarettes  . Smokeless tobacco: Never Used  . Tobacco comment: 5-15 cigs per day  Vaping Use  . Vaping Use: Never used  Substance Use Topics  . Alcohol use: Yes  . Drug use: No     Allergies   Patient has no known allergies.   Review of  Systems Review of Systems  Constitutional: Negative for appetite change, chills, diaphoresis, fever and unexpected weight change.  HENT: Negative for congestion, ear pain, sinus pressure, sinus pain, sneezing, sore throat and trouble swallowing.   Respiratory: Negative for cough, chest tightness and shortness of breath.   Cardiovascular: Negative for chest pain.  Gastrointestinal: Positive for abdominal pain, diarrhea, nausea and vomiting. Negative for abdominal distention, anal bleeding, blood in stool, constipation and rectal pain.  Genitourinary: Negative for dysuria, flank pain, frequency  and urgency.  Musculoskeletal: Negative for back pain and myalgias.  Neurological: Negative for dizziness, light-headedness and headaches.  All other systems reviewed and are negative.    Physical Exam Triage Vital Signs ED Triage Vitals  Enc Vitals Group     BP 09/17/20 1303 (!) 152/94     Pulse Rate 09/17/20 1303 88     Resp 09/17/20 1303 20     Temp 09/17/20 1303 98.2 F (36.8 C)     Temp Source 09/17/20 1303 Oral     SpO2 09/17/20 1303 96 %     Weight --      Height --      Head Circumference --      Peak Flow --      Pain Score 09/17/20 1300 4     Pain Loc --      Pain Edu? --      Excl. in GC? --    No data found.  Updated Vital Signs BP (!) 152/94 (BP Location: Left Arm)   Pulse 88   Temp 98.2 F (36.8 C) (Oral)   Resp 20   SpO2 96%   Visual Acuity Right Eye Distance:   Left Eye Distance:   Bilateral Distance:    Right Eye Near:   Left Eye Near:    Bilateral Near:     Physical Exam Vitals reviewed.  Constitutional:      General: He is not in acute distress.    Appearance: Normal appearance. He is not ill-appearing.  HENT:     Head: Normocephalic and atraumatic.     Mouth/Throat:     Mouth: Mucous membranes are moist.  Eyes:     Extraocular Movements: Extraocular movements intact.     Pupils: Pupils are equal, round, and reactive to light.  Cardiovascular:     Rate and Rhythm: Normal rate and regular rhythm.     Heart sounds: Normal heart sounds.  Pulmonary:     Effort: Pulmonary effort is normal.     Breath sounds: Rhonchi present. No decreased breath sounds, wheezing or rales.     Comments: Few rhonchi. Hyperinflated lung fields. Abdominal:     General: Abdomen is protuberant. Bowel sounds are normal. There is no distension.     Palpations: Abdomen is soft. There is no mass.     Tenderness: There is no abdominal tenderness. There is no right CVA tenderness, left CVA tenderness, guarding or rebound. Negative signs include Murphy's sign,  Rovsing's sign and McBurney's sign.  Skin:    Capillary Refill: Capillary refill takes less than 2 seconds.  Neurological:     General: No focal deficit present.     Mental Status: He is alert and oriented to person, place, and time.  Psychiatric:        Mood and Affect: Mood normal.        Behavior: Behavior normal.        Thought Content: Thought content normal.        Judgment: Judgment  normal.      UC Treatments / Results  Labs (all labs ordered are listed, but only abnormal results are displayed) Labs Reviewed - No data to display  EKG   Radiology No results found.  Procedures Procedures (including critical care time)  Medications Ordered in UC Medications - No data to display  Initial Impression / Assessment and Plan / UC Course  I have reviewed the triage vital signs and the nursing notes.  Pertinent labs & imaging results that were available during my care of the patient were reviewed by me and considered in my medical decision making (see chart for details).      This patient is a 55 year old male presenting with viral gastroenteritis. Today he is  afebrile nontachycardic nontachypneic, oxygenating well on room air.  Appears well hydrated.   zofran and probiotic sent. Continue imodium prn. BRAT diet, rec good hydration.  For hypertension, this is currently fairly well controlled continue antihypertensives.   Current everyday smoker. precontemplative of quitting.   Return precautions discussed.  This chart was dictated using voice recognition software, Dragon. Despite the best efforts of this provider to proofread and correct errors, errors may still occur which can change documentation meaning.   Final Clinical Impressions(s) / UC Diagnoses   Final diagnoses:  Non-intractable vomiting with nausea, unspecified vomiting type  Viral gastroenteritis  Essential hypertension  Current smoker     Discharge Instructions     -Start the nausea  medication- zofran, up to 3 pills daily as needed. Dissolve this medication under your tongue. -Also start the probiotic- one pill taken at night for at least 1 week.  -Drink plenty of fluids and eat a bland diet as tolerated -Come back and see [place] if you develop worsening of symptoms like inability to hydrate by mouth, abdominal pain, shortness of breath, chest pain, etc    ED Prescriptions    Medication Sig Dispense Auth. Provider   ondansetron (ZOFRAN ODT) 8 MG disintegrating tablet Take 1 tablet (8 mg total) by mouth every 8 (eight) hours as needed for nausea or vomiting. 20 tablet Rhys Martini, PA-C   Saccharomyces boulardii (PROBIOTIC) 250 MG CAPS Take 1 capsule by mouth daily. 14 capsule Rhys Martini, PA-C     PDMP not reviewed this encounter.   Rhys Martini, PA-C 09/17/20 1341

## 2020-09-17 NOTE — Discharge Instructions (Addendum)
-  Start the nausea medication- zofran, up to 3 pills daily as needed. Dissolve this medication under your tongue. -Also start the probiotic- one pill taken at night for at least 1 week.  -Drink plenty of fluids and eat a bland diet as tolerated -Come back and see [place] if you develop worsening of symptoms like inability to hydrate by mouth, abdominal pain, shortness of breath, chest pain, etc

## 2020-09-18 ENCOUNTER — Encounter: Payer: Self-pay | Admitting: Internal Medicine

## 2020-10-11 ENCOUNTER — Encounter: Payer: Self-pay | Admitting: Internal Medicine

## 2020-11-04 ENCOUNTER — Encounter: Payer: Self-pay | Admitting: Internal Medicine

## 2020-11-04 ENCOUNTER — Other Ambulatory Visit: Payer: Self-pay

## 2020-11-04 ENCOUNTER — Other Ambulatory Visit (HOSPITAL_COMMUNITY): Payer: Self-pay

## 2020-11-04 ENCOUNTER — Ambulatory Visit (INDEPENDENT_AMBULATORY_CARE_PROVIDER_SITE_OTHER): Payer: Self-pay | Admitting: Internal Medicine

## 2020-11-04 VITALS — BP 124/88 | HR 88 | Temp 98.5°F | Ht 70.5 in | Wt 292.7 lb

## 2020-11-04 DIAGNOSIS — Z6841 Body Mass Index (BMI) 40.0 and over, adult: Secondary | ICD-10-CM

## 2020-11-04 DIAGNOSIS — Z0000 Encounter for general adult medical examination without abnormal findings: Secondary | ICD-10-CM

## 2020-11-04 DIAGNOSIS — I10 Essential (primary) hypertension: Secondary | ICD-10-CM

## 2020-11-04 DIAGNOSIS — E66813 Obesity, class 3: Secondary | ICD-10-CM

## 2020-11-04 DIAGNOSIS — E119 Type 2 diabetes mellitus without complications: Secondary | ICD-10-CM

## 2020-11-04 MED ORDER — METFORMIN HCL 500 MG PO TABS
500.0000 mg | ORAL_TABLET | Freq: Every day | ORAL | 2 refills | Status: AC
  Filled 2020-11-04: qty 30, 30d supply, fill #0

## 2020-11-04 NOTE — Patient Instructions (Signed)
Thank you, Daniel Hanson for allowing [place] to provide your care today. Today we discussed Diabetes Mellitus.    I have ordered the following labs for you:  Lab Orders  No laboratory test(s) ordered today     Tests ordered today:    Referrals ordered today:    Referral Orders     Ambulatory referral to diabetic education   Medication Changes:   There are no discontinued medications.   Meds ordered this encounter  Medications  . metFORMIN (GLUCOPHAGE) 500 MG tablet    Sig: Take 1 tablet (500 mg total) by mouth daily with breakfast.    Dispense:  30 tablet    Refill:  2    IM program     Instructions: - See attached paperwork  Follow up: 1 month     Should you have any questions or concerns please call the internal medicine clinic at [phone].     Dellia Cloud, D.O. [facility]  ELEIGIBILITY REQUIREMENTS FOR FINANCIAL ASSISTANCE FOR UNINSURED PATIENTS        Once you have all required information, please call the Internal Medicine Center [facility]) at [phone] to schedule an appointment to complete an application for assistance. [facility] is located on the ground floor, [address] [place]. Hours are Monday through Thursday 9 AM to noon and Friday 9:15 AM to noon.   ITEM NOTES    Driver's license OR Picture ID OR Passport No temporary ID's or expired ID's accepted.    Current proof of residency Examples: light bill, water bill, or gas bill.    W-2 and Federal and state tax return. If self employed, include schedule "C". If you did not file taxes, you must provide IRS documentation that you did not file taxes. Call the local IRS office at [phone] to schedule an appointment with the IRS.   Current 3 months of pay stubs for this year. Not required if unemployed.    Current print out of any other sources of income. Examples : Social Security benefit letter, Unemployment print out, [employer] comp statement.   Current 3 months of  checking  and savings bank statements.    Current statement for pensions, 401K, IRA's, CD's,  etc. Current balance must be included.    Current food stamp award letter showing amount received monthly.  Obtain letter from social services. Food stamp cards are not accepted.   Tax value on cars. Can get from [facility] tax department or provide a print off from [employer].    Rental agrement OR mortage statement.     Tax value on homes, mobile homes, and land. Can get from [facility] tax department.    Life insurance cover page. If a whole life policy, the paperwork needs to show type of coverage, cash amount, and cash value payout.   Disability paperwork showing status of case. May call [phone] to sch appointment to file for disability.    Monies received for college education and receipts showing tuition cost and book expenses.  Not applicable if not in college.    If you have no income, you need to provide a letter of support. The letter must be notarized and must include who provides you shelter, how long they are providing you shelter, the address where you are receiving shelter along with their name, signature, address, and phone number so the the information may be verified.     Diabetes Mellitus and Nutrition, Adult When you have diabetes,  or diabetes mellitus, it is very important to have healthy eating habits because your blood sugar (glucose) levels are greatly affected by what you eat and drink. Eating healthy foods in the right amounts, at about the same times every day, can help you:  Control your blood glucose.  Lower your risk of heart disease.  Improve your blood pressure.  Reach or maintain a healthy weight. What can affect my meal plan? Every person with diabetes is different, and each person has different needs for a meal plan. Your health care provider may recommend that you work with a dietitian to make a meal plan that is best for you. Your meal plan  may vary depending on factors such as:  The calories you need.  The medicines you take.  Your weight.  Your blood glucose, blood pressure, and cholesterol levels.  Your activity level.  Other health conditions you have, such as heart or kidney disease. How do carbohydrates affect me? Carbohydrates, also called carbs, affect your blood glucose level more than any other type of food. Eating carbs naturally raises the amount of glucose in your blood. Carb counting is a method for keeping track of how many carbs you eat. Counting carbs is important to keep your blood glucose at a healthy level, especially if you use insulin or take certain oral diabetes medicines. It is important to know how many carbs you can safely have in each meal. This is different for every person. Your dietitian can help you calculate how many carbs you should have at each meal and for each snack. How does alcohol affect me? Alcohol can cause a sudden decrease in blood glucose (hypoglycemia), especially if you use insulin or take certain oral diabetes medicines. Hypoglycemia can be a life-threatening condition. Symptoms of hypoglycemia, such as sleepiness, dizziness, and confusion, are similar to symptoms of having too much alcohol.  Do not drink alcohol if: ? Your health care provider tells you not to drink. ? You are pregnant, may be pregnant, or are planning to become pregnant.  If you drink alcohol: ? Do not drink on an empty stomach. ? Limit how much you use to:  0-1 drink a day for women.  0-2 drinks a day for men. ? Be aware of how much alcohol is in your drink. In the [place] one drink equals one 12 oz bottle of beer (355 mL), one 5 oz glass of wine (148 mL), or one 1 oz glass of hard liquor (44 mL). ? Keep yourself hydrated with water, diet soda, or unsweetened iced tea.  Keep in mind that regular soda, juice, and other mixers may contain a lot of sugar and must be counted as carbs. What are tips for  following this plan? Reading food labels  Start by checking the serving size on the "Nutrition Facts" label of packaged foods and drinks. The amount of calories, carbs, fats, and other nutrients listed on the label is based on one serving of the item. Many items contain more than one serving per package.  Check the total grams (g) of carbs in one serving. You can calculate the number of servings of carbs in one serving by dividing the total carbs by 15. For example, if a food has 30 g of total carbs per serving, it would be equal to 2 servings of carbs.  Check the number of grams (g) of saturated fats and trans fats in one serving. Choose foods that have a low amount or none of these fats.  Check the number of milligrams (mg) of salt (sodium) in one serving. Most people should limit total sodium intake to less than 2,300 mg per day.  Always check the nutrition information of foods labeled as "low-fat" or "nonfat." These foods may be higher in added sugar or refined carbs and should be avoided.  Talk to your dietitian to identify your daily goals for nutrients listed on the label. Shopping  Avoid buying canned, pre-made, or processed foods. These foods tend to be high in fat, sodium, and added sugar.  Shop around the outside edge of the grocery store. This is where you will most often find fresh fruits and vegetables, bulk grains, fresh meats, and fresh dairy. Cooking  Use low-heat cooking methods, such as baking, instead of high-heat cooking methods like deep frying.  Cook using healthy oils, such as olive, canola, or sunflower oil.  Avoid cooking with butter, cream, or high-fat meats. Meal planning  Eat meals and snacks regularly, preferably at the same times every day. Avoid going long periods of time without eating.  Eat foods that are high in fiber, such as fresh fruits, vegetables, beans, and whole grains. Talk with your dietitian about how many servings of carbs you can eat at  each meal.  Eat 4-6 oz (112-168 g) of lean protein each day, such as lean meat, chicken, fish, eggs, or tofu. One ounce (oz) of lean protein is equal to: ? 1 oz (28 g) of meat, chicken, or fish. ? 1 egg. ?  cup (62 g) of tofu.  Eat some foods each day that contain healthy fats, such as avocado, nuts, seeds, and fish.   What foods should I eat? Fruits Berries. Apples. Oranges. Peaches. Apricots. Plums. Grapes. Mango. Papaya. Pomegranate. Kiwi. Cherries. Vegetables Lettuce. Spinach. Leafy greens, including kale, chard, collard greens, and mustard greens. Beets. Cauliflower. Cabbage. Broccoli. Carrots. Green beans. Tomatoes. Peppers. Onions. Cucumbers. Brussels sprouts. Grains Whole grains, such as whole-wheat or whole-grain bread, crackers, tortillas, cereal, and pasta. Unsweetened oatmeal. Quinoa. Brown or wild rice. Meats and other proteins Seafood. Poultry without skin. Lean cuts of poultry and beef. Tofu. Nuts. Seeds. Dairy Low-fat or fat-free dairy products such as milk, yogurt, and cheese. The items listed above may not be a complete list of foods and beverages you can eat. Contact a dietitian for more information. What foods should I avoid? Fruits Fruits canned with syrup. Vegetables Canned vegetables. Frozen vegetables with butter or cream sauce. Grains Refined white flour and flour products such as bread, pasta, snack foods, and cereals. Avoid all processed foods. Meats and other proteins Fatty cuts of meat. Poultry with skin. Breaded or fried meats. Processed meat. Avoid saturated fats. Dairy Full-fat yogurt, cheese, or milk. Beverages Sweetened drinks, such as soda or iced tea. The items listed above may not be a complete list of foods and beverages you should avoid. Contact a dietitian for more information. Questions to ask a health care provider  Do I need to meet with a diabetes educator?  Do I need to meet with a dietitian?  What number can I call if I have  questions?  When are the best times to check my blood glucose? Where to find more information:  American Diabetes Association: diabetes.org  Academy of Nutrition and Dietetics: www.eatright.[employer] of Diabetes and Digestive and Kidney Diseases: CarFlippers.tn  Association of Diabetes Care and Education Specialists: www.diabeteseducator.org Summary  It is important to have healthy eating habits because your blood sugar (glucose) levels are greatly  affected by what you eat and drink.  A healthy meal plan will help you control your blood glucose and maintain a healthy lifestyle.  Your health care provider may recommend that you work with a dietitian to make a meal plan that is best for you.  Keep in mind that carbohydrates (carbs) and alcohol have immediate effects on your blood glucose levels. It is important to count carbs and to use alcohol carefully. This information is not intended to replace advice given to you by your health care provider. Make sure you discuss any questions you have with your health care provider. Document Revised: 06/06/2019 Document Reviewed: 06/06/2019 Elsevier Patient Education  2021 [employer].

## 2020-11-04 NOTE — Progress Notes (Signed)
CC: DM  HPI:  [name] is a 55 y.o. male with a past medical history stated below and presents today for DM. Please see problem based assessment and plan for additional details.  Past Medical History:  Diagnosis Date  . Hypertension   . Neck fracture (HCC) 1986    Current Outpatient Medications on File Prior to Visit  Medication Sig Dispense Refill  . diclofenac Sodium (VOLTAREN) 1 % GEL Apply 2 g topically 4 (four) times daily. Apply topical gel to affected area 4 (four) times daily as needed. 200 g 3  . furosemide (LASIX) 40 MG tablet Take 1 tablet (40 mg total) by mouth daily. 90 tablet 1  . gabapentin (NEURONTIN) 300 MG capsule Take 1 capsule (300 mg total) by mouth 3 (three) times daily as needed. 90 capsule 2  . ibuprofen (ADVIL) 600 MG tablet Take 1 tablet (600 mg total) by mouth every 6 (six) hours as needed for moderate pain. 30 tablet 0  . lisinopril (ZESTRIL) 10 MG tablet Take 1 tablet (10 mg total) by mouth daily. 30 tablet 1  . ondansetron (ZOFRAN ODT) 8 MG disintegrating tablet Take 1 tablet (8 mg total) by mouth every 8 (eight) hours as needed for nausea or vomiting. 20 tablet 0  . Saccharomyces boulardii (PROBIOTIC) 250 MG CAPS Take 1 capsule by mouth daily. 14 capsule 0  . [DISCONTINUED] promethazine (PHENERGAN) 12.5 MG tablet Take 1 tablet (12.5 mg total) by mouth every 6 (six) hours as needed. 12 tablet 0   No current facility-administered medications on file prior to visit.    Family History  Problem Relation Age of Onset  . Stroke Father   . Parkinson's disease Father     Social History   Socioeconomic History  . Marital status: Divorced    Spouse name: Not on file  . Number of children: Not on file  . Years of education: Not on file  . Highest education level: Not on file  Occupational History  . Not on file  Tobacco Use  . Smoking status: Current Every Day Smoker    Packs/day: 0.50    Types: Cigarettes  . Smokeless tobacco:  Never Used  . Tobacco comment: 1/2 pack per day   Vaping Use  . Vaping Use: Never used  Substance and Sexual Activity  . Alcohol use: Yes  . Drug use: No  . Sexual activity: Yes  Other Topics Concern  . Not on file  Social History Narrative  . Not on file   Social Determinants of Health   Financial Resource Strain: Not on file  Food Insecurity: Not on file  Transportation Needs: Not on file  Physical Activity: Not on file  Stress: Not on file  Social Connections: Not on file  Intimate Partner Violence: Not on file    Review of Systems: ROS negative except for what is noted on the assessment and plan.  Vitals:   11/04/20 0930  BP: 124/88  Pulse: 88  Temp: 98.5 F (36.9 C)  TempSrc: Oral  SpO2: 95%  Weight: 292 lb 11.2 oz (132.8 kg)  Height: 5' 10.5" (1.791 m)    Physical Exam: Gen: A&O x3 and in no apparent distress, well appearing and nourished. HEENT: Head - normocephalic, atraumatic. Eye -  visual acuity grossly intact, conjunctiva clear, sclera non-icteric, EOM intact. Mouth - No obvious caries or periodontal disease. Neck: no obvious masses or nodules, AROM intact. CV: RRR, no murmurs, rubs, or gallops. S1/S2 presents  Resp:  Clear to ascultation bilaterally  Abd: BS (+) x4, soft, non-tender, without obvious hepatosplenomegaly or masses MSK: Grossly normal AROM and strength x4 extremities. Skin: good skin turgor, no rashes, unusual bruising, or prominent lesions.  Neuro: No focal deficits, grossly normal sensation and coordination. Paresthesias in his bilateral hands.  Psych: Oriented x3 and responding appropriately. Intact recent and remote memory, normal mood, judgement, affect , and insight.    Assessment & Plan:   See Encounters Tab for problem based charting.  Patient discussed with Dr. Gustavo Lah, D.O. [facility], PGY-2 Pager: [phone], Phone: [phone] Date 11/06/2020 Time 10:09 AM

## 2020-11-06 ENCOUNTER — Encounter: Payer: Self-pay | Admitting: Internal Medicine

## 2020-11-06 NOTE — Assessment & Plan Note (Signed)
Gave patient handout regarding [place] card information.

## 2020-11-06 NOTE — Assessment & Plan Note (Signed)
Counseled regarding lifestyle modifications. Referred to Diabetes educations.

## 2020-11-06 NOTE — Assessment & Plan Note (Signed)
SUBJECTIVE: Patient presents for further evaluation and management of his DM.   He is on the following DM medications:  1. No previous medications  Medication Adherence: recently diagnosed and not on medicaitons.  Medication SE: none Barriers: Knowledge Deficits related to basic Diabetes pathophysiology and self care/management Knowledge Deficits related to medications used for management of diabetes Financial Constraints (no insurance).   Monitoring: does not have a home glucometer.  DM S/Sx: Admits to parethesias in his hands bialterally. Denies foot ulcerations, nausea, paresthesia of the feet, visual disturbances, vomitting and weight loss.   Exercise: unknown Diet: unknown  Eye Exam: no previous Foot Exam: no previous.  Labs: Glucose-Capillary  Date/Time Value Ref Range Status  09/04/2020 11:43 AM 139 (H) 70 - 99 mg/dL Final    Comment:    Glucose reference range applies only to samples taken after fasting for at least 8 hours.    Last A1c was  Lab Results  Component Value Date   HGBA1C 7.4 (A) 09/04/2020  .   ASSESSMENT: uncontrolled Type 2 Diabetes Mellitus without long term use of insulin with end-organ damage (peripheral neuropathy)   PLAN: 1. Counseling: a. Counseled regarding the pathophysiology of diabetes and risk/benefits of medication management. b. Weight loss of five to seven percent of body weight at a rate of 0.5 to 1.0 kg per week (1 to 2 lb per week). c. Moderate-intensity aerobic exercise for 30-60 minutes most days. 2. Medications: start metformin 500 mg daily and titrate up. 3. Goals:  a. adherence to prescribed medication regimen b. CBG - 100-120  c. Hgb A1c: <7 4. Referred to Diabetic education. 5. Follow up in 3 months.

## 2020-11-06 NOTE — Assessment & Plan Note (Addendum)
Blood pressure well controlled on lisinopril 10 mg. Continue lisinopril. Recently had BMP drawn at the end of Feb. Will get BMP at next appointment when he has the orange card.   Recent blood pressures:  BP Readings from Last 3 Encounters:  11/04/20 124/88  09/17/20 (!) 152/94  09/04/20 (!) 125/94

## 2020-11-08 NOTE — Progress Notes (Signed)
Internal Medicine Clinic Attending  Case discussed with Dr. Coe  At the time of the visit.  We reviewed the resident's history and exam and pertinent patient test results.  I agree with the assessment, diagnosis, and plan of care documented in the resident's note.  

## 2020-11-12 ENCOUNTER — Ambulatory Visit: Payer: Self-pay | Admitting: Neurology

## 2020-11-13 ENCOUNTER — Ambulatory Visit (HOSPITAL_COMMUNITY)
Admission: EM | Admit: 2020-11-13 | Discharge: 2020-11-13 | Disposition: A | Discharge status: Home / Self Care | Payer: Self-pay | Attending: Urgent Care | Admitting: Urgent Care

## 2020-11-13 ENCOUNTER — Encounter (HOSPITAL_COMMUNITY): Payer: Self-pay

## 2020-11-13 ENCOUNTER — Other Ambulatory Visit: Payer: Self-pay

## 2020-11-13 ENCOUNTER — Other Ambulatory Visit (HOSPITAL_COMMUNITY): Payer: Self-pay

## 2020-11-13 DIAGNOSIS — R5383 Other fatigue: Secondary | ICD-10-CM | POA: Insufficient documentation

## 2020-11-13 DIAGNOSIS — K644 Residual hemorrhoidal skin tags: Secondary | ICD-10-CM | POA: Insufficient documentation

## 2020-11-13 DIAGNOSIS — R198 Other specified symptoms and signs involving the digestive system and abdomen: Secondary | ICD-10-CM | POA: Insufficient documentation

## 2020-11-13 DIAGNOSIS — K625 Hemorrhage of anus and rectum: Secondary | ICD-10-CM | POA: Insufficient documentation

## 2020-11-13 DIAGNOSIS — Z8719 Personal history of other diseases of the digestive system: Secondary | ICD-10-CM | POA: Insufficient documentation

## 2020-11-13 LAB — CBC
HCT: 48.8 % (ref 39.0–52.0)
Hemoglobin: 15.9 g/dL (ref 13.0–17.0)
MCH: 33 pg (ref 26.0–34.0)
MCHC: 32.6 g/dL (ref 30.0–36.0)
MCV: 101.2 fL — ABNORMAL HIGH (ref 80.0–100.0)
Platelets: 216 10*3/uL (ref 150–400)
RBC: 4.82 MIL/uL (ref 4.22–5.81)
RDW: 13.4 % (ref 11.5–15.5)
WBC: 7.8 10*3/uL (ref 4.0–10.5)
nRBC: 0 % (ref 0.0–0.2)

## 2020-11-13 LAB — POCT URINALYSIS DIPSTICK, ED / UC
Bilirubin Urine: NEGATIVE
Glucose, UA: NEGATIVE mg/dL
Hgb urine dipstick: NEGATIVE
Ketones, ur: NEGATIVE mg/dL
Leukocytes,Ua: NEGATIVE
Nitrite: NEGATIVE
Protein, ur: NEGATIVE mg/dL
Specific Gravity, Urine: >=1.03 (ref 1.005–1.030)
Urobilinogen, UA: 1 mg/dL (ref 0.0–1.0)
pH: 5 (ref 5.0–8.0)

## 2020-11-13 LAB — TSH: TSH: 2.256 u[IU]/mL (ref 0.350–4.500)

## 2020-11-13 LAB — CBG MONITORING, ED: Glucose-Capillary: 142 mg/dL — ABNORMAL HIGH (ref 70–99)

## 2020-11-13 MED ORDER — DOCUSATE SODIUM 100 MG PO CAPS
100.0000 mg | ORAL_CAPSULE | Freq: Two times a day (BID) | ORAL | 0 refills | Status: AC
  Filled 2020-11-13: qty 60, 30d supply, fill #0

## 2020-11-13 MED ORDER — HYDROCORTISONE ACETATE 25 MG RE SUPP
25.0000 mg | Freq: Two times a day (BID) | RECTAL | 0 refills | Status: AC
Start: 2020-11-13 — End: ?
  Filled 2020-11-13: qty 12, 6d supply, fill #0

## 2020-11-13 NOTE — ED Triage Notes (Signed)
Pt presents with fatigue, rectal bleeding when having a bowel movement, and generalized body aches.  Pt states he just started taking metformin last week for his diabetes.

## 2020-11-13 NOTE — ED Provider Notes (Signed)
[facility]   MRN: [id number] DOB: [birth year]  Subjective:   NORMON PETTIJOHN is a 55 y.o. male presenting for 3 to 5-day history of acute onset moderate fatigue, rectal bleeding, straining to defecate, myalgia.  Patient states that he started metformin last week for his diabetes, has had an A1c of less than 8%.  Also has a history of hemorrhoids, has not felt any perianal pain but does see some intermittent light bleeding when he wipes.  Has been years since he got hemorrhoid.  Denies fever, headache, confusion, chest pain, shortness of breath, abdominal pain, hematuria.  He does have an appointment with his regular doctor tomorrow.  No current facility-administered medications for this encounter.  Current Outpatient Medications:  .  diclofenac Sodium (VOLTAREN) 1 % GEL, Apply 2 g topically 4 (four) times daily. Apply topical gel to affected area 4 (four) times daily as needed., Disp: 200 g, Rfl: 3 .  furosemide (LASIX) 40 MG tablet, Take 1 tablet (40 mg total) by mouth daily., Disp: 90 tablet, Rfl: 1 .  gabapentin (NEURONTIN) 300 MG capsule, Take 1 capsule (300 mg total) by mouth 3 (three) times daily as needed., Disp: 90 capsule, Rfl: 2 .  ibuprofen (ADVIL) 600 MG tablet, Take 1 tablet (600 mg total) by mouth every 6 (six) hours as needed for moderate pain., Disp: 30 tablet, Rfl: 0 .  lisinopril (ZESTRIL) 10 MG tablet, Take 1 tablet (10 mg total) by mouth daily., Disp: 30 tablet, Rfl: 1 .  metFORMIN (GLUCOPHAGE) 500 MG tablet, Take 1 tablet (500 mg total) by mouth daily with breakfast., Disp: 30 tablet, Rfl: 2 .  ondansetron (ZOFRAN ODT) 8 MG disintegrating tablet, Take 1 tablet (8 mg total) by mouth every 8 (eight) hours as needed for nausea or vomiting., Disp: 20 tablet, Rfl: 0 .  Saccharomyces boulardii (PROBIOTIC) 250 MG CAPS, Take 1 capsule by mouth daily., Disp: 14 capsule, Rfl: 0   No Known Allergies  Past Medical History:  Diagnosis Date  . Hypertension    . Neck fracture (HCC) 1986     Past Surgical History:  Procedure Laterality Date  . IRRIGATION AND DEBRIDEMENT KNEE Left 10/09/2017   Procedure: IRRIGATION AND DEBRIDEMENT KNEE;  Surgeon: Cammy Copa, MD;  Location: [facility];  Service: Orthopedics;  Laterality: Left;    Family History  Problem Relation Age of Onset  . Stroke Father   . Parkinson's disease Father     Social History   Tobacco Use  . Smoking status: Current Every Day Smoker    Packs/day: 0.50    Types: Cigarettes  . Smokeless tobacco: Never Used  . Tobacco comment: 1/2 pack per day   Vaping Use  . Vaping Use: Never used  Substance Use Topics  . Alcohol use: Yes  . Drug use: No    ROS   Objective:   Vitals: BP 121/81 (BP Location: Right Arm)   Pulse 91   Temp 97.7 F (36.5 C) (Oral)   Resp 20   SpO2 96%   Physical Exam Constitutional:      General: He is not in acute distress.    Appearance: Normal appearance. He is well-developed. He is obese. He is not ill-appearing, toxic-appearing or diaphoretic.  HENT:     Head: Normocephalic and atraumatic.     Right Ear: External ear normal.     Left Ear: External ear normal.     Nose: Nose normal.     Mouth/Throat:  Mouth: Mucous membranes are moist.     Pharynx: Oropharynx is clear.  Eyes:     General: No scleral icterus.    Extraocular Movements: Extraocular movements intact.     Pupils: Pupils are equal, round, and reactive to light.  Cardiovascular:     Rate and Rhythm: Normal rate and regular rhythm.     Heart sounds: Normal heart sounds. No murmur heard. No friction rub. No gallop.   Pulmonary:     Effort: Pulmonary effort is normal. No respiratory distress.     Breath sounds: Normal breath sounds. No stridor. No wheezing, rhonchi or rales.  Genitourinary:    Rectum: External hemorrhoid present. No tenderness, anal fissure or internal hemorrhoid.  Neurological:     Mental Status: He is alert and oriented to person, place, and  time.  Psychiatric:        Mood and Affect: Mood normal.        Behavior: Behavior normal.        Thought Content: Thought content normal.     Results for orders placed or performed during the hospital encounter of 11/13/20 (from the past 24 hour(s))  POC Urinalysis dipstick     Status: None   Collection Time: 11/13/20 12:43 PM  Result Value Ref Range   Glucose, UA NEGATIVE NEGATIVE mg/dL   Bilirubin Urine NEGATIVE NEGATIVE   Ketones, ur NEGATIVE NEGATIVE mg/dL   Specific Gravity, Urine >=1.030 1.005 - 1.030   Hgb urine dipstick NEGATIVE NEGATIVE   pH 5.0 5.0 - 8.0   Protein, ur NEGATIVE NEGATIVE mg/dL   Urobilinogen, UA 1.0 0.0 - 1.0 mg/dL   Nitrite NEGATIVE NEGATIVE   Leukocytes,Ua NEGATIVE NEGATIVE  POC CBG monitoring     Status: Abnormal   Collection Time: 11/13/20 12:51 PM  Result Value Ref Range   Glucose-Capillary 142 (H) 70 - 99 mg/dL    Assessment and Plan :   PDMP not reviewed this encounter.  1. External hemorrhoid   2. Other fatigue   3. Rectal bleeding   4. History of hemorrhoids   5. Straining during bowel movements     Labs pending, patient has a clear cardiopulmonary exam.  On exam he has nonthrombosed external hemorrhoids.  Recommended stool softener, high-fiber diet, hydrocortisone suppositories.  We will follow-up with patient if he has critical lab values but otherwise expect him to follow-up with his PCP tomorrow.  Maintain home medications. Counseled patient on potential for adverse effects with medications prescribed/recommended today, ER and return-to-clinic precautions discussed, patient verbalized understanding.    Wallis Bamberg, PA-C 11/13/20 1306

## 2020-11-14 ENCOUNTER — Other Ambulatory Visit: Payer: Self-pay

## 2020-11-14 ENCOUNTER — Encounter: Payer: Self-pay | Admitting: Student

## 2020-11-14 ENCOUNTER — Ambulatory Visit (INDEPENDENT_AMBULATORY_CARE_PROVIDER_SITE_OTHER): Payer: Self-pay | Admitting: Student

## 2020-11-14 VITALS — BP 139/100 | HR 75 | Temp 98.1°F | Ht 70.0 in | Wt 290.2 lb

## 2020-11-14 DIAGNOSIS — I10 Essential (primary) hypertension: Secondary | ICD-10-CM

## 2020-11-14 DIAGNOSIS — Z720 Tobacco use: Secondary | ICD-10-CM

## 2020-11-14 DIAGNOSIS — R42 Dizziness and giddiness: Secondary | ICD-10-CM

## 2020-11-14 DIAGNOSIS — K644 Residual hemorrhoidal skin tags: Secondary | ICD-10-CM

## 2020-11-14 DIAGNOSIS — E119 Type 2 diabetes mellitus without complications: Secondary | ICD-10-CM

## 2020-11-14 DIAGNOSIS — R600 Localized edema: Secondary | ICD-10-CM

## 2020-11-14 DIAGNOSIS — R5383 Other fatigue: Secondary | ICD-10-CM

## 2020-11-14 NOTE — Assessment & Plan Note (Addendum)
Has not been taking lisinopril for the past 2 months, unable to provide reason.  A&P: Poorly controlled, BP today 147/99 with rechecks of 143/108 and 139/100 in setting of discontinued lisinopril use possibly due to financial burden or grief from loss of father - Restart lisinopril - Offered grief counseling through [facility] - Scheduled appointment for [employer] - Encouraged home monitoring

## 2020-11-14 NOTE — Patient Instructions (Signed)
Thank you, Mr.Vinny W Keahey for allowing [place] to provide your care today. Today we discussed  Fatigue We believe your fatigue is due to sleep apnea. Please work with financial counseling as well as obtaining your own personal insurance if you wish. Please make an appointment at the front desk to meet with financial counseling.   Hypertension Please start taking your lisinopril and checking your blood pressure at home.   Diabetes Please return in one month and we will check your diabetes and discuss blood pressure  Diet Recommendations Great job cutting out sugar from your coffee! Keep up the good work! This will help with your diabetes, high blood pressure, and sleep apnea.   Smoking We are here for you if you would like to quit smoking and have resources as well if needed.  I have ordered the following labs for you:  Lab Orders  No laboratory test(s) ordered today      Referrals ordered today:   Referral Orders  No referral(s) requested today     I have ordered the following medication/changed the following medications:   Stop the following medications: There are no discontinued medications.   Start the following medications: No orders of the defined types were placed in this encounter.    Follow up: 1 month    Remember: PLEASE SCHEDULE AN APPOINTMENT WITH FINANCIAL COUNSELING.   Should you have any questions or concerns please call the internal medicine clinic at [phone].     Thalia Bloodgood, D.O. [facility]

## 2020-11-14 NOTE — Assessment & Plan Note (Addendum)
Endorses a couple episodes of dizziness and associated tinnitus lasting 30 seconds. 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 Has never had a formal hearing evaluation.  A&P: Uncomplicated and acute - Consider to ENT if persists - Will discuss further at next visit

## 2020-11-14 NOTE — Assessment & Plan Note (Addendum)
[name] was seen in urgent care yesterday for 2-5 days of rectal bleeding and straining to defecate and found to have an external hemorrhoid on exam without anal fissuring. CBC and UA unremarkable. Recommended high fiber diet, docusate, and hydrocortisone suppository. Now reports resolution of bleeding.   A&P: Uncomplicated external hemorrhoid - continue docusate and high fiber diet. Stop hydrocortisone suppository after 2-3 days. - consider using MiraLax for ongoing constipation

## 2020-11-14 NOTE — Assessment & Plan Note (Addendum)
Patient presents with subacute exacerbation of chronic daytime fatigue over the past two weeks. He was drinking coffee with 5 tsp of sugar daily but stopped two weeks ago. Has not had sleep study done yet due to being uninsured. He also endorses irregular eating with fatty foods and frequently skipping meals. Reports loss of father several months ago but is doing well with mood.  A&P: Acute exacerbation of daytime fatigue likely due to untreated underlying OSA (STOP BANG 7) and recent cessation of daily caffeine intake. Also considered anemia due to recent rectal bleeding from hemorrhoid but reassured by Hb of 15.9 yesterday. CBC notable for macrocytosis. Ruled out hypothyroidism due to normal TSH yesterday. Physical exam notable for decreased breath sounds at bilateral lung bases.  - Regulate diet (eat meals at regular times, avoid skipping meals, and eat more whole foods instead of processed foods) - Encouraged weight loss (current BMI 41.64) - Set up appointment for [facility] Card in order to proceed with sleep study  - Offered grief counseling through [facility] - Consider evaluation of macrocytosis at next visit - assess alcohol use and check B12 level

## 2020-11-14 NOTE — Assessment & Plan Note (Addendum)
Reports smoking daily for 20 years and is bothered by chronic cough. Has quit once cold [place] and reports being open to quitting again. Not interested in medications.  A&P: Current smoker in contemplative phase - Provided tobacco cessation counseling  - Consider low dose CT for lung cancer screening after obtaining [employer] Card - consider assessment of COPD at next visit

## 2020-11-14 NOTE — Assessment & Plan Note (Signed)
Recently eliminated daily coffee with 5 tsp of sugar. Tolerating metformin well without diarrhea. Reports neuropathic symptoms.  A&P: Moderately controlled diabetes with glucose of 142 yesterday and A1C of 7.4 2 months ago - Commended recent dietary changes and encouraged eating whole foods instead of processed foods in addition to avoiding skipping meals.  - Recheck A1C in 1 month - expect reduction to goal (<7) due to recent dietary changes

## 2020-11-14 NOTE — Progress Notes (Signed)
This is a Psychologist, occupational Note.  The care of the patient was discussed with Dr. Evie Lacks and the assessment and plan was formulated with their assistance.    Subjective:   Patient ID: Daniel Hanson male   DOB: 1965-11-11 55 y.o.   MRN: [id number]  HPI: Daniel Hanson is a 55 y.o. male with a past medical history of hypertension and diabetes who presents today for an Urgent Care follow up for external hemorrhoids.     Please see problem based charting for more details.   Past Medical History:  Diagnosis Date  . Hypertension   . Neck fracture (HCC) 1986   Current Outpatient Medications  Medication Sig Dispense Refill  . diclofenac Sodium (VOLTAREN) 1 % GEL Apply 2 g topically 4 (four) times daily. Apply topical gel to affected area 4 (four) times daily as needed. 200 g 3  . docusate sodium (COLACE) 100 MG capsule Take 1 capsule (100 mg total) by mouth every 12 (twelve) hours. 60 capsule 0  . furosemide (LASIX) 40 MG tablet Take 1 tablet (40 mg total) by mouth daily. 90 tablet 1  . gabapentin (NEURONTIN) 300 MG capsule Take 1 capsule (300 mg total) by mouth 3 (three) times daily as needed. 90 capsule 2  . hydrocortisone (ANUSOL-HC) 25 MG suppository Unwrap and place 1 suppository (25 mg total) rectally 2 (two) times daily. 12 suppository 0  . ibuprofen (ADVIL) 600 MG tablet Take 1 tablet (600 mg total) by mouth every 6 (six) hours as needed for moderate pain. 30 tablet 0  . lisinopril (ZESTRIL) 10 MG tablet Take 1 tablet (10 mg total) by mouth daily. 30 tablet 1  . metFORMIN (GLUCOPHAGE) 500 MG tablet Take 1 tablet (500 mg total) by mouth daily with breakfast. 30 tablet 2  . ondansetron (ZOFRAN ODT) 8 MG disintegrating tablet Take 1 tablet (8 mg total) by mouth every 8 (eight) hours as needed for nausea or vomiting. 20 tablet 0  . Saccharomyces boulardii (PROBIOTIC) 250 MG CAPS Take 1 capsule by mouth daily. 14 capsule 0   No current facility-administered  medications for this visit.   Family History  Problem Relation Age of Onset  . Stroke Father   . Parkinson's disease Father    Social History   Socioeconomic History  . Marital status: Divorced    Spouse name: Not on file  . Number of children: Not on file  . Years of education: Not on file  . Highest education level: Not on file  Occupational History  . Not on file  Tobacco Use  . Smoking status: Current Every Day Smoker    Packs/day: 0.50    Types: Cigarettes  . Smokeless tobacco: Never Used  . Tobacco comment: 1/2 pack per day   Vaping Use  . Vaping Use: Never used  Substance and Sexual Activity  . Alcohol use: Yes  . Drug use: No  . Sexual activity: Yes  Other Topics Concern  . Not on file  Social History Narrative  . Not on file   Social Determinants of Health   Financial Resource Strain: Not on file  Food Insecurity: Not on file  Transportation Needs: Not on file  Physical Activity: Not on file  Stress: Not on file  Social Connections: Not on file   Review of Systems: Pertinent items noted in HPI and remainder of comprehensive ROS otherwise negative. Objective:  Physical Exam: Vitals:   11/14/20 1354 11/14/20 1509  BP: (!) 147/99 (!) 139/100  Pulse: 78 75  Temp: 98.1 F (36.7 C)   TempSrc: Oral   SpO2: 95%   Weight: 290 lb 3.2 oz (131.6 kg)   Height: 5\' 10"  (1.778 m)     General: Well appearing and in no acute distress, appears stated age Neuro: A&O x4, normal affect Cardiovascular: RRR, no m/r/g. Normal S1 and S2 without S3 or S4. Pulses 2+ in bilateral UE and LE (radial and DP). No peripheral edema Pulmonary: Decreased breath sounds at bilateral lung bases. No wheezes, rhonchi or rales. Normal WOB, no clubbing. Skin: Warm and dry with no rashes, cuts, or bruises   Assessment & Plan:  Please see problem based charting for more details

## 2020-11-14 NOTE — Assessment & Plan Note (Signed)
Denies being symptomatic currently and has stopped taking furosemide.   A&P: Well controlled, not noted on exam today. - OK to stop furosemide, can restart if worsening

## 2020-11-20 NOTE — Progress Notes (Signed)
Internal Medicine Clinic Attending  Case discussed with Dr. Katsadouros  and student doctor Doshi at the time of the visit.  We reviewed the history and exam and pertinent patient test results.  I agree with the assessment, diagnosis, and plan of care documented in the student's note.  

## 2020-12-17 ENCOUNTER — Ambulatory Visit (INDEPENDENT_AMBULATORY_CARE_PROVIDER_SITE_OTHER): Payer: Self-pay | Admitting: Internal Medicine

## 2020-12-17 ENCOUNTER — Ambulatory Visit: Payer: Self-pay

## 2020-12-17 ENCOUNTER — Other Ambulatory Visit (HOSPITAL_COMMUNITY): Payer: Self-pay

## 2020-12-17 VITALS — BP 149/98 | HR 96 | Temp 98.2°F | Ht 70.0 in | Wt 289.8 lb

## 2020-12-17 DIAGNOSIS — E119 Type 2 diabetes mellitus without complications: Secondary | ICD-10-CM

## 2020-12-17 DIAGNOSIS — M25511 Pain in right shoulder: Secondary | ICD-10-CM

## 2020-12-17 LAB — POCT GLYCOSYLATED HEMOGLOBIN (HGB A1C): Hemoglobin A1C: 6.7 % — AB (ref 4.0–5.6)

## 2020-12-17 LAB — GLUCOSE, CAPILLARY: Glucose-Capillary: 132 mg/dL — ABNORMAL HIGH (ref 70–99)

## 2020-12-17 MED ORDER — IBUPROFEN 600 MG PO TABS
600.0000 mg | ORAL_TABLET | Freq: Four times a day (QID) | ORAL | 0 refills | Status: AC | PRN
  Filled 2020-12-17: qty 30, 8d supply, fill #0

## 2020-12-17 NOTE — Patient Instructions (Addendum)
To Mr. Schoffstall,  It was a pleasure meeting you today. Today we discussed your diabetes and shoulder pain.   For your diabetes, we will continue taking the metformin medication once daily. Your A1c today is much improved from your last. Continue working on weight loss.   For your shoulder pain, we will prescribe you Ibuprofen. Please take this medication as needed on bad days. Please refrain from activities that exacerbate your shoulder pain. We will reassess your shoulder pain in 4-6 weeks.   Have a good day!  Dolan Amen, MD

## 2020-12-18 ENCOUNTER — Encounter: Payer: Self-pay | Admitting: Internal Medicine

## 2020-12-18 DIAGNOSIS — M25519 Pain in unspecified shoulder: Secondary | ICD-10-CM | POA: Insufficient documentation

## 2020-12-18 NOTE — Progress Notes (Signed)
   CC: Shoulder Pain, Diabetes Follow Up  HPI:  Mr.Tarez W Prophete is a 55 y.o. M/F, with a PMH noted below, who presents to the clinic shoulder pain and diabetes management. To see the management of their acute and chronic conditions, please see the A&P note under the Encounters tab.   Past Medical History:  Diagnosis Date  . Hypertension   . Neck fracture (HCC) 1986   Review of Systems:   Review of Systems  Constitutional: Negative for chills, fever and weight loss.  Respiratory: Negative for cough.   Cardiovascular: Negative for chest pain and palpitations.  Gastrointestinal: Negative for abdominal pain, diarrhea, nausea and vomiting.  Musculoskeletal: Positive for joint pain. Negative for myalgias and neck pain.       R shoulder pain     Physical Exam:  Vitals:   12/17/20 1423  BP: (!) 149/98  Pulse: 96  Temp: 98.2 F (36.8 C)  TempSrc: Oral  SpO2: 96%  Weight: 289 lb 12.8 oz (131.5 kg)  Height: 5\' 10"  (1.778 m)   Physical Exam Constitutional:      General: He is not in acute distress.    Appearance: He is obese. He is not ill-appearing, toxic-appearing or diaphoretic.     Comments: Appears stated age, NAD, answers questions appropriately.  Cardiovascular:     Rate and Rhythm: Normal rate and regular rhythm.     Pulses: Normal pulses.     Heart sounds: Normal heart sounds. No murmur heard. No friction rub. No gallop.   Pulmonary:     Effort: Pulmonary effort is normal.     Breath sounds: Normal breath sounds. No wheezing, rhonchi or rales.  Abdominal:     General: Bowel sounds are normal.     Palpations: Abdomen is soft.     Tenderness: There is no abdominal tenderness.  Musculoskeletal:        General: No swelling, tenderness or signs of injury.     Comments: + Neer test, negative empty can in the R shoulder.   Skin:    Findings: No bruising, erythema or rash.  Neurological:     Mental Status: He is alert.      Assessment & Plan:   See  Encounters Tab for problem based charting.  Patient discussed with Dr. 

## 2020-12-18 NOTE — Assessment & Plan Note (Signed)
Patient presents with acute R shoulder pain one month in duration. 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 He states that the pain is aggravated by lifting above his head when he is working on cars. His pain is also aggravated by sleeping on the affected side at night time. He has not tried anything to alleviate his pain. He rates his pain as a 6/10.   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 His physical exam findings are concerning for shoulder impingement, likely secondary to tendonitis. We discussed conservative measures, which he is agreeable to trial. Discussed NSAID use as needed for severe pain. Discussed side effects of ulcerations with patient. He voices understanding.  - Ibuprofen 600 mg Q6H PRN -

## 2020-12-18 NOTE — Assessment & Plan Note (Signed)
A1c today improved to 6.7 from 7.2 on metformin 500 mg QD. Patient has been compliant with the medication and has not experienced any side effects. He is also working on his diet and has stopped adding sugar to his beverages.  - Continue Metformin 500 mg QD - FU in 3 months

## 2020-12-23 NOTE — Progress Notes (Signed)
Internal Medicine Clinic Attending ? ?Case discussed with Dr. Winters  At the time of the visit.  We reviewed the resident?s history and exam and pertinent patient test results.  I agree with the assessment, diagnosis, and plan of care documented in the resident?s note.  ?

## 2020-12-24 ENCOUNTER — Telehealth: Payer: Self-pay | Admitting: Internal Medicine

## 2020-12-24 NOTE — Telephone Encounter (Signed)
   Reason for call:  I received a call from the [facility] EMS/Coroner's Office regarinding [name] [facility] at 10:30 PM indicating that Daniel Hanson was pronounced dead this evening.    Pertinent Data:  The official states that the patient was outside mowing his grass when the incident occurred. His wife found him down some time later. EMS was called, and he was pronounced dead this evening. The official states that it was deemed to be due to organic causes.    Assessment / Plan / Recommendations:  The [facility] Coroner's office will send the death certificate the [facility].   Daniel Hanson, D.O.  Internal Medicine Resident, PGY-2 [facility]  Pager: [phone] 10:30 PM, 01-02-2021

## 2020-12-25 ENCOUNTER — Other Ambulatory Visit (HOSPITAL_COMMUNITY): Payer: Self-pay

## 2021-01-10 DEATH — deceased

## 2021-02-18 ENCOUNTER — Encounter: Payer: Self-pay | Admitting: Internal Medicine

## 2021-02-19 ENCOUNTER — Telehealth: Payer: Self-pay | Admitting: Dietician

## 2021-02-19 NOTE — Telephone Encounter (Signed)
Significant other states that Daniel Hanson passed away in 12/25/22. Will ask front office to mark his chart as deceased.
